# Patient Record
Sex: Male | Born: 1965 | Smoking: Never smoker
Health system: Southern US, Community
[De-identification: ages and names within clinical notes are randomized; demographics above are authoritative.]

---

## 2017-08-16 ENCOUNTER — Other Ambulatory Visit: Payer: Self-pay | Admitting: Cardiology

## 2017-08-16 DIAGNOSIS — R079 Chest pain, unspecified: Secondary | ICD-10-CM

## 2017-08-20 ENCOUNTER — Encounter (HOSPITAL_COMMUNITY)
Admission: RE | Admit: 2017-08-20 | Discharge: 2017-08-20 | Disposition: A | Discharge status: Home / Self Care | Payer: BLUE CROSS/BLUE SHIELD | Source: Ambulatory Visit | Attending: Cardiology | Admitting: Cardiology

## 2017-08-20 DIAGNOSIS — R079 Chest pain, unspecified: Secondary | ICD-10-CM

## 2017-08-20 MED ORDER — TECHNETIUM TC 99M TETROFOSMIN IV KIT
30.0000 | PACK | Freq: Once | INTRAVENOUS | Status: AC | PRN
  Administered 2017-08-20: 30 via INTRAVENOUS

## 2017-08-20 MED ORDER — TECHNETIUM TC 99M TETROFOSMIN IV KIT
10.0000 | PACK | Freq: Once | INTRAVENOUS | Status: AC | PRN
  Administered 2017-08-20: 10 via INTRAVENOUS

## 2017-08-20 MED ORDER — REGADENOSON 0.4 MG/5ML IV SOLN
INTRAVENOUS | Status: AC
  Filled 2017-08-20: qty 5

## 2017-08-20 MED ORDER — REGADENOSON 0.4 MG/5ML IV SOLN
0.4000 mg | Freq: Once | INTRAVENOUS | Status: AC
  Administered 2017-08-20: 0.4 mg via INTRAVENOUS

## 2017-08-29 ENCOUNTER — Encounter (HOSPITAL_COMMUNITY): Payer: Self-pay | Admitting: Emergency Medicine

## 2017-08-29 ENCOUNTER — Inpatient Hospital Stay (HOSPITAL_COMMUNITY)
Admission: AD | Admit: 2017-08-29 | Discharge: 2017-09-01 | DRG: 281 | Disposition: A | Discharge status: Home / Self Care | Payer: BLUE CROSS/BLUE SHIELD | Source: Other Acute Inpatient Hospital | Attending: Cardiology | Admitting: Cardiology

## 2017-08-29 ENCOUNTER — Other Ambulatory Visit: Payer: Self-pay

## 2017-08-29 DIAGNOSIS — K219 Gastro-esophageal reflux disease without esophagitis: Secondary | ICD-10-CM | POA: Diagnosis present

## 2017-08-29 DIAGNOSIS — E785 Hyperlipidemia, unspecified: Secondary | ICD-10-CM | POA: Diagnosis present

## 2017-08-29 DIAGNOSIS — N184 Chronic kidney disease, stage 4 (severe): Secondary | ICD-10-CM | POA: Diagnosis present

## 2017-08-29 DIAGNOSIS — I420 Dilated cardiomyopathy: Secondary | ICD-10-CM | POA: Diagnosis present

## 2017-08-29 DIAGNOSIS — I214 Non-ST elevation (NSTEMI) myocardial infarction: Principal | ICD-10-CM | POA: Diagnosis present

## 2017-08-29 DIAGNOSIS — E1122 Type 2 diabetes mellitus with diabetic chronic kidney disease: Secondary | ICD-10-CM | POA: Diagnosis present

## 2017-08-29 DIAGNOSIS — J189 Pneumonia, unspecified organism: Secondary | ICD-10-CM

## 2017-08-29 DIAGNOSIS — I249 Acute ischemic heart disease, unspecified: Secondary | ICD-10-CM | POA: Diagnosis present

## 2017-08-29 DIAGNOSIS — I252 Old myocardial infarction: Secondary | ICD-10-CM

## 2017-08-29 DIAGNOSIS — I25110 Atherosclerotic heart disease of native coronary artery with unstable angina pectoris: Secondary | ICD-10-CM | POA: Diagnosis present

## 2017-08-29 DIAGNOSIS — I2109 ST elevation (STEMI) myocardial infarction involving other coronary artery of anterior wall: Secondary | ICD-10-CM | POA: Diagnosis present

## 2017-08-29 DIAGNOSIS — K8020 Calculus of gallbladder without cholecystitis without obstruction: Secondary | ICD-10-CM | POA: Diagnosis present

## 2017-08-29 DIAGNOSIS — I871 Compression of vein: Secondary | ICD-10-CM

## 2017-08-29 LAB — HEPARIN LEVEL (UNFRACTIONATED)

## 2017-08-29 LAB — GLUCOSE, CAPILLARY
GLUCOSE-CAPILLARY: 124 mg/dL — AB (ref 65–99)
Glucose-Capillary: 140 mg/dL — ABNORMAL HIGH (ref 65–99)
Glucose-Capillary: 132 mg/dL — ABNORMAL HIGH (ref 65–99)

## 2017-08-29 LAB — TROPONIN I
TROPONIN I: 0.18 ng/mL — AB (ref <0.03)
TROPONIN I: 0.2 ng/mL — AB (ref <0.03)
TROPONIN I: 0.17 ng/mL — AB (ref <0.03)

## 2017-08-29 LAB — COMPREHENSIVE METABOLIC PANEL
ALBUMIN: 2.3 g/dL — AB (ref 3.5–5.0)
ALK PHOS: 223 U/L — AB (ref 38–126)
ALT: 16 U/L — ABNORMAL LOW (ref 17–63)
AST: 22 U/L (ref 15–41)
Anion gap: 8 (ref 5–15)
BILIRUBIN TOTAL: 1.1 mg/dL (ref 0.3–1.2)
BUN: 38 mg/dL — AB (ref 6–20)
CALCIUM: 9.2 mg/dL (ref 8.9–10.3)
CO2: 25 mmol/L (ref 22–32)
Chloride: 107 mmol/L (ref 101–111)
Creatinine, Ser: 2.32 mg/dL — ABNORMAL HIGH (ref 0.61–1.24)
GFR calc Af Amer: 36 mL/min — ABNORMAL LOW (ref >60)
GFR, EST NON AFRICAN AMERICAN: 31 mL/min — AB (ref >60)
GLUCOSE: 112 mg/dL — AB (ref 65–99)
Potassium: 4.3 mmol/L (ref 3.5–5.1)
Sodium: 140 mmol/L (ref 135–145)
TOTAL PROTEIN: 6 g/dL — AB (ref 6.5–8.1)

## 2017-08-29 LAB — CBC WITH DIFFERENTIAL/PLATELET
BASOS ABS: 0 10*3/uL (ref 0.0–0.1)
BASOS PCT: 1 %
EOS PCT: 1 %
Eosinophils Absolute: 0.1 10*3/uL (ref 0.0–0.7)
HEMATOCRIT: 38.8 % — AB (ref 39.0–52.0)
Hemoglobin: 13 g/dL (ref 13.0–17.0)
Lymphocytes Relative: 20 %
Lymphs Abs: 1.3 10*3/uL (ref 0.7–4.0)
MCH: 30.3 pg (ref 26.0–34.0)
MCHC: 33.5 g/dL (ref 30.0–36.0)
MCV: 90.4 fL (ref 78.0–100.0)
MONO ABS: 0.7 10*3/uL (ref 0.1–1.0)
MONOS PCT: 10 %
NEUTROS ABS: 4.6 10*3/uL (ref 1.7–7.7)
Neutrophils Relative %: 68 %
Platelets: 196 10*3/uL (ref 150–400)
RBC: 4.29 MIL/uL (ref 4.22–5.81)
RDW: 14.2 % (ref 11.5–15.5)
WBC: 6.8 10*3/uL (ref 4.0–10.5)

## 2017-08-29 LAB — HEMOGLOBIN A1C
Hgb A1c MFr Bld: 6.7 % — ABNORMAL HIGH (ref 4.8–5.6)
Mean Plasma Glucose: 145.59 mg/dL

## 2017-08-29 LAB — MRSA PCR SCREENING: MRSA BY PCR: NEGATIVE

## 2017-08-29 LAB — APTT: aPTT: 35 seconds (ref 24–36)

## 2017-08-29 LAB — MAGNESIUM: MAGNESIUM: 1.8 mg/dL (ref 1.7–2.4)

## 2017-08-29 LAB — TSH: TSH: 2.633 u[IU]/mL (ref 0.350–4.500)

## 2017-08-29 LAB — PROTIME-INR
INR: 1.18
Prothrombin Time: 15 seconds (ref 11.4–15.2)

## 2017-08-29 MED ORDER — INSULIN ASPART 100 UNIT/ML ~~LOC~~ SOLN
0.0000 [IU] | Freq: Three times a day (TID) | SUBCUTANEOUS | Status: DC
  Administered 2017-08-29 (×2): 1 [IU] via SUBCUTANEOUS
  Administered 2017-08-30: 2 [IU] via SUBCUTANEOUS
  Administered 2017-08-30 – 2017-08-31 (×2): 1 [IU] via SUBCUTANEOUS
  Administered 2017-09-01: 2 [IU] via SUBCUTANEOUS

## 2017-08-29 MED ORDER — ENSURE ENLIVE PO LIQD
237.0000 mL | Freq: Two times a day (BID) | ORAL | Status: DC
  Administered 2017-08-29 – 2017-08-30 (×2): 237 mL via ORAL

## 2017-08-29 MED ORDER — DEXTROSE 5 % IV SOLN
1.0000 g | INTRAVENOUS | Status: DC
  Administered 2017-08-30 – 2017-09-01 (×3): 1 g via INTRAVENOUS
  Filled 2017-08-29 (×3): qty 10

## 2017-08-29 MED ORDER — HEPARIN (PORCINE) IN NACL 100-0.45 UNIT/ML-% IJ SOLN: 1100.0000 [IU]/h | INTRAMUSCULAR | Status: DC

## 2017-08-29 MED ORDER — SODIUM CHLORIDE 0.9 % IV SOLN
INTRAVENOUS | Status: DC
  Administered 2017-08-29 – 2017-08-30 (×3): via INTRAVENOUS

## 2017-08-29 MED ORDER — ACETAMINOPHEN 325 MG PO TABS
650.0000 mg | ORAL_TABLET | ORAL | Status: DC | PRN
  Administered 2017-08-29: 650 mg via ORAL
  Filled 2017-08-29: qty 2

## 2017-08-29 MED ORDER — NITROGLYCERIN 0.4 MG SL SUBL: 0.4000 mg | SUBLINGUAL_TABLET | SUBLINGUAL | Status: DC | PRN

## 2017-08-29 MED ORDER — PANTOPRAZOLE SODIUM 40 MG PO TBEC
40.0000 mg | DELAYED_RELEASE_TABLET | Freq: Every day | ORAL | Status: DC
  Administered 2017-08-29 – 2017-08-31 (×3): 40 mg via ORAL
  Filled 2017-08-29 (×4): qty 1

## 2017-08-29 MED ORDER — ASPIRIN 300 MG RE SUPP: 300.0000 mg | RECTAL | Status: AC

## 2017-08-29 MED ORDER — NITROGLYCERIN IN D5W 200-5 MCG/ML-% IV SOLN: 5.0000 ug/min | INTRAVENOUS | Status: DC

## 2017-08-29 MED ORDER — METOPROLOL TARTRATE 12.5 MG HALF TABLET
12.5000 mg | ORAL_TABLET | Freq: Two times a day (BID) | ORAL | Status: DC
  Administered 2017-08-29 – 2017-09-01 (×7): 12.5 mg via ORAL
  Filled 2017-08-29 (×7): qty 1

## 2017-08-29 MED ORDER — ASPIRIN 81 MG PO CHEW
324.0000 mg | CHEWABLE_TABLET | ORAL | Status: AC
  Administered 2017-08-29: 324 mg via ORAL
  Filled 2017-08-29: qty 4

## 2017-08-29 MED ORDER — ATORVASTATIN CALCIUM 80 MG PO TABS
80.0000 mg | ORAL_TABLET | Freq: Every day | ORAL | Status: DC
  Administered 2017-08-29 – 2017-08-30 (×2): 80 mg via ORAL
  Filled 2017-08-29 (×2): qty 1

## 2017-08-29 MED ORDER — HEPARIN BOLUS VIA INFUSION
3000.0000 [IU] | Freq: Once | INTRAVENOUS | Status: AC
Start: 2017-08-29 — End: 2017-08-29
  Administered 2017-08-29: 3000 [IU] via INTRAVENOUS
  Filled 2017-08-29: qty 3000

## 2017-08-29 MED ORDER — HEPARIN BOLUS VIA INFUSION
3000.0000 [IU] | Freq: Once | INTRAVENOUS | Status: AC
  Administered 2017-08-29: 3000 [IU] via INTRAVENOUS
  Filled 2017-08-29: qty 3000

## 2017-08-29 MED ORDER — CLOPIDOGREL BISULFATE 75 MG PO TABS
75.0000 mg | ORAL_TABLET | Freq: Once | ORAL | Status: AC
  Administered 2017-08-29: 75 mg via ORAL
  Filled 2017-08-29: qty 1

## 2017-08-29 MED ORDER — ONDANSETRON HCL 4 MG/2ML IJ SOLN
4.0000 mg | Freq: Four times a day (QID) | INTRAMUSCULAR | Status: DC | PRN
  Administered 2017-08-29 – 2017-09-01 (×2): 4 mg via INTRAVENOUS
  Filled 2017-08-29 (×2): qty 2

## 2017-08-29 MED ORDER — HEPARIN (PORCINE) IN NACL 100-0.45 UNIT/ML-% IJ SOLN
1700.0000 [IU]/h | INTRAMUSCULAR | Status: DC
  Administered 2017-08-29: 1350 [IU]/h via INTRAVENOUS
  Administered 2017-08-30: 1700 [IU]/h via INTRAVENOUS
  Filled 2017-08-29 (×2): qty 250

## 2017-08-29 MED ORDER — ASPIRIN EC 81 MG PO TBEC
81.0000 mg | DELAYED_RELEASE_TABLET | Freq: Every day | ORAL | Status: DC
  Administered 2017-08-30 – 2017-09-01 (×2): 81 mg via ORAL
  Filled 2017-08-29 (×2): qty 1

## 2017-08-29 NOTE — Progress Notes (Signed)
ANTICOAGULATION CONSULT NOTE  Pharmacy Consult for heparin Indication: chest pain/ACS  No Known Allergies  Patient Measurements: Height: 5\' 5"  (165.1 cm) Weight: 152 lb 1.9 oz (69 kg) IBW/kg (Calculated) : 61.5 Heparin Dosing Weight: 69kg  Vital Signs: Temp: 98.7 F (37.1 C) (09/02 1941) Temp Source: Oral (09/02 1941) BP: 118/75 (09/02 1941) Pulse Rate: 73 (09/02 1941)  Labs:  Recent Labs  08/29/17 1002 08/29/17 1100 08/29/17 1519 08/29/17 2015  HGB 13.0  --   --   --   HCT 38.8*  --   --   --   PLT 196  --   --   --   APTT 35  --   --   --   LABPROT 15.0  --   --   --   INR 1.18  --   --   --   HEPARINUNFRC  --  <0.10*  --  <0.10*  CREATININE 2.32*  --   --   --   TROPONINI 0.18*  --  0.20*  --     Estimated Creatinine Clearance: 33.1 mL/min (A) (by C-G formula based on SCr of 2.32 mg/dL (H)).   Medical History: No past medical history on file.  Assessment: 51 year old male transferred from [facility] for ischemic evaluation after ruling in for nstemi. Patient was transferred to [facility] on IV heparin and ntg. -heparin level remains undetectable after bolus and increase   Goal of Therapy:  Heparin level 0.3-0.7 units/ml Monitor platelets by anticoagulation protocol: Yes   Plan:  -heparin 3000 unit bolus and increase to 1350 units/hr -Heparin level in 6 hours and daily wth CBC daily  Harland German, Pharm D 08/29/2017 8:50 PM

## 2017-08-29 NOTE — Progress Notes (Signed)
ANTICOAGULATION CONSULT NOTE - Initial Consult  Pharmacy Consult for heparin Indication: chest pain/ACS  No Known Allergies  Patient Measurements: Height: 5\' 5"  (165.1 cm) Weight: 152 lb 1.9 oz (69 kg) IBW/kg (Calculated) : 61.5 Heparin Dosing Weight: 69kg  Vital Signs: Temp: 98.6 F (37 C) (09/02 1149) Temp Source: Oral (09/02 1149) BP: 123/80 (09/02 1150)  Labs:  Recent Labs  08/29/17 1002 08/29/17 1100  HGB 13.0  --   HCT 38.8*  --   PLT 196  --   APTT 35  --   LABPROT 15.0  --   INR 1.18  --   HEPARINUNFRC  --  <0.10*  CREATININE 2.32*  --   TROPONINI 0.18*  --     Estimated Creatinine Clearance: 33.1 mL/min (A) (by C-G formula based on SCr of 2.32 mg/dL (H)).   Medical History: No past medical history on file.  Assessment: 51 year old male transferred from [facility] for ischemic evaluation after ruling in for nstemi. Patient was transferred to [facility] on IV heparin and ntg.  Initial heparin level on 830 units/hr was undetectable. No issues noted, will titrate rate.   Goal of Therapy:  Heparin level 0.3-0.7 units/ml Monitor platelets by anticoagulation protocol: Yes   Plan:  Give 3000 units bolus x 1 Start heparin infusion at 1100 units/hr Check anti-Xa level in 6 hours and daily while on heparin Continue to monitor H&H and platelets  Sheppard Coil PharmD., BCPS Clinical Pharmacist Pager [phone] 08/29/2017 1:14 PM

## 2017-08-29 NOTE — Progress Notes (Signed)
RN notified patients troponin elevated at 0.18, Dr. Sharyn Lull updated, received no new orders at this time.

## 2017-08-29 NOTE — H&P (Signed)
Marcus Whitaker is an 51 y.o. male.   Chief Complaint: Chest pain. 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 Patient went to [facility] because of recurrent left-sided chest pain associated with nausea took 3 sublingual nitroglycerin without relief. States Chest Pain Was Grade 4/10 and Lasted Approximately One Hour. EKG Done at [facility] Showed Anterolateral Wall MI with T-Wave Inversion in Lateral Leads and Was Noted to Have Minimally Elevated Troponin I Patient Is Transferred Here for Further Evaluation and Treatment. Patient Was Started on Heparin and IV Nitroglycerin with Relief of Chest Pain EKG from [facility] Is Not Available. Patient Was Recently Seen in My Office Vague Chest Pain off and on Subsequently Had Nuclear Stress Test Which Showed Large Fixed Inferolateral Wall Infarct with EF of 34% Noninvasive Risk Stratification High.  No past medical history on file.  No past surgical history on file.  No family history on file. Social History:  has no tobacco, alcohol, and drug history on file.  Allergies: Allergies not on file  No prescriptions prior to admission.    No results found for this or any previous visit (from the past 48 hour(s)). No results found.  Review of Systems  Constitutional: Negative for chills and fever.  Eyes: Negative for blurred vision.  Respiratory: Negative for cough and shortness of breath.   Cardiovascular: Positive for chest pain.  Gastrointestinal: Positive for nausea.  Genitourinary: Negative for dysuria.  Neurological: Negative for dizziness.    Blood pressure 130/84, temperature 98.3 F (36.8 C), temperature source  Oral. Physical Exam  Constitutional: He is oriented to person, place, and time.  HENT:  Head: Normocephalic and atraumatic.  Eyes: Pupils are equal, round, and reactive to light. Conjunctivae are normal. Left eye exhibits no discharge. No scleral icterus.  Neck: Normal range of motion. Neck supple. No JVD present. No tracheal deviation present. No thyromegaly present.  Cardiovascular: Normal rate and regular rhythm.   Murmur (2/6 systolic murmur and S4 gallop noted) heard. Respiratory: Effort normal and breath sounds normal. No respiratory distress. He has no wheezes. He has no rales.  GI: Soft. Bowel sounds are normal. He exhibits no distension. There is no tenderness.  Musculoskeletal: He exhibits no edema, tenderness or deformity.  Neurological: He is alert and oriented to person, place, and time.     Assessment/Plan Acute coronary syndrome Coronary artery disease history of anterolateral wall MI age and determined Diabetes mellitus Chronic kidney disease Hyperlipidemia GERD Plan As per orders Discussed with patient at length regarding left cardiac catheterization possible PTCA stenting its risk and benefits i.e. death MI stroke need for emergency CABG local vascular complications worsening renal function except and consents for PCI. Will schedule him for Tuesday  Rinaldo Cloud, MD 08/29/2017, 9:27 AM

## 2017-08-30 ENCOUNTER — Inpatient Hospital Stay (HOSPITAL_COMMUNITY): Payer: BLUE CROSS/BLUE SHIELD

## 2017-08-30 ENCOUNTER — Encounter (HOSPITAL_COMMUNITY): Payer: Self-pay

## 2017-08-30 LAB — CBC
HCT: 41.6 % (ref 39.0–52.0)
Hemoglobin: 14 g/dL (ref 13.0–17.0)
MCH: 30.9 pg (ref 26.0–34.0)
MCHC: 33.7 g/dL (ref 30.0–36.0)
MCV: 91.8 fL (ref 78.0–100.0)
PLATELETS: 183 10*3/uL (ref 150–400)
RBC: 4.53 MIL/uL (ref 4.22–5.81)
RDW: 14.3 % (ref 11.5–15.5)
WBC: 7.4 10*3/uL (ref 4.0–10.5)

## 2017-08-30 LAB — URINALYSIS, ROUTINE W REFLEX MICROSCOPIC
BILIRUBIN URINE: NEGATIVE
Glucose, UA: NEGATIVE mg/dL
HGB URINE DIPSTICK: NEGATIVE
Ketones, ur: NEGATIVE mg/dL
LEUKOCYTES UA: NEGATIVE
NITRITE: NEGATIVE
PH: 6 (ref 5.0–8.0)
Protein, ur: >=300 mg/dL — AB
SPECIFIC GRAVITY, URINE: 1.009 (ref 1.005–1.030)

## 2017-08-30 LAB — GLUCOSE, CAPILLARY
GLUCOSE-CAPILLARY: 91 mg/dL (ref 65–99)
GLUCOSE-CAPILLARY: 183 mg/dL — AB (ref 65–99)
Glucose-Capillary: 131 mg/dL — ABNORMAL HIGH (ref 65–99)
Glucose-Capillary: 105 mg/dL — ABNORMAL HIGH (ref 65–99)

## 2017-08-30 LAB — BASIC METABOLIC PANEL
Anion gap: 12 (ref 5–15)
BUN: 40 mg/dL — AB (ref 6–20)
CALCIUM: 8.7 mg/dL — AB (ref 8.9–10.3)
CO2: 19 mmol/L — ABNORMAL LOW (ref 22–32)
Chloride: 103 mmol/L (ref 101–111)
Creatinine, Ser: 2.28 mg/dL — ABNORMAL HIGH (ref 0.61–1.24)
GFR calc Af Amer: 37 mL/min — ABNORMAL LOW (ref >60)
GFR, EST NON AFRICAN AMERICAN: 32 mL/min — AB (ref >60)
GLUCOSE: 141 mg/dL — AB (ref 65–99)
Potassium: 4.5 mmol/L (ref 3.5–5.1)
SODIUM: 134 mmol/L — AB (ref 135–145)

## 2017-08-30 LAB — LIPID PANEL
CHOLESTEROL: 204 mg/dL — AB (ref 0–200)
HDL: 40 mg/dL — ABNORMAL LOW (ref >40)
LDL Cholesterol: 150 mg/dL — ABNORMAL HIGH (ref 0–99)
TRIGLYCERIDES: 72 mg/dL (ref <150)
Total CHOL/HDL Ratio: 5.1 RATIO
VLDL: 14 mg/dL (ref 0–40)

## 2017-08-30 LAB — PROTIME-INR
INR: 1.26
Prothrombin Time: 15.7 seconds — ABNORMAL HIGH (ref 11.4–15.2)

## 2017-08-30 LAB — HEPARIN LEVEL (UNFRACTIONATED)
HEPARIN UNFRACTIONATED: 0.12 [IU]/mL — AB (ref 0.30–0.70)
Heparin Unfractionated: 0.27 IU/mL — ABNORMAL LOW (ref 0.30–0.70)

## 2017-08-30 MED ORDER — SODIUM BICARBONATE 8.4 % IV SOLN
INTRAVENOUS | Status: DC
  Filled 2017-08-30: qty 1000

## 2017-08-30 MED ORDER — ASPIRIN 81 MG PO CHEW
81.0000 mg | CHEWABLE_TABLET | ORAL | Status: AC
  Administered 2017-08-31: 81 mg via ORAL
  Filled 2017-08-30: qty 1

## 2017-08-30 MED ORDER — SODIUM BICARBONATE BOLUS VIA INFUSION
INTRAVENOUS | Status: DC
  Filled 2017-08-30: qty 1

## 2017-08-30 MED ORDER — CLOPIDOGREL BISULFATE 300 MG PO TABS
300.0000 mg | ORAL_TABLET | Freq: Once | ORAL | Status: AC
  Administered 2017-08-31: 300 mg via ORAL
  Filled 2017-08-30: qty 1

## 2017-08-30 MED ORDER — HEPARIN BOLUS VIA INFUSION
2000.0000 [IU] | Freq: Once | INTRAVENOUS | Status: AC
  Administered 2017-08-30: 2000 [IU] via INTRAVENOUS
  Filled 2017-08-30: qty 2000

## 2017-08-30 MED ORDER — SODIUM CHLORIDE 0.9 % IV BOLUS (SEPSIS)
250.0000 mL | Freq: Once | INTRAVENOUS | Status: AC
  Administered 2017-08-30: 250 mL via INTRAVENOUS

## 2017-08-30 MED ORDER — SODIUM CHLORIDE 0.9% FLUSH
3.0000 mL | Freq: Two times a day (BID) | INTRAVENOUS | Status: DC
  Administered 2017-08-30 – 2017-08-31 (×2): 3 mL via INTRAVENOUS

## 2017-08-30 MED ORDER — ENSURE ENLIVE PO LIQD
237.0000 mL | Freq: Three times a day (TID) | ORAL | Status: DC
  Administered 2017-08-30 – 2017-09-01 (×4): 237 mL via ORAL

## 2017-08-30 MED ORDER — SODIUM CHLORIDE 0.9 % IV BOLUS (SEPSIS)
250.0000 mL | Freq: Once | INTRAVENOUS | Status: AC | PRN
  Administered 2017-08-30: 250 mL via INTRAVENOUS

## 2017-08-30 MED ORDER — SODIUM CHLORIDE 0.9 % WEIGHT BASED INFUSION
1.0000 mL/kg/h | INTRAVENOUS | Status: DC
  Administered 2017-08-31: 250 mL via INTRAVENOUS
  Administered 2017-08-31: 1 mL/kg/h via INTRAVENOUS

## 2017-08-30 MED ORDER — SODIUM CHLORIDE 0.9% FLUSH: 3.0000 mL | INTRAVENOUS | Status: DC | PRN

## 2017-08-30 MED ORDER — SODIUM CHLORIDE 0.9 % IV SOLN: 250.0000 mL | INTRAVENOUS | Status: DC | PRN

## 2017-08-30 NOTE — Progress Notes (Signed)
Patient has no appetite; RN has ordered things he suggests he might eat and family have brought in multiple different foods, but patient states he just doesn't feel like eating.  He states he has lost over 30# in the last 4-5 months and suddenly lost his desire to eat.  States he has seen his PCP and had an EGD which turned up nothing.  Dr. Sharyn Lull paged.  New order received for CT abd/pelvis w/o contrast

## 2017-08-30 NOTE — Progress Notes (Signed)
Subjective:  Spiked fever to 103 last night pancultures were obtained started on Rocephin. Denies any urinary complaints. Denies abdominal pain. Denies any cough. No further chest pain. IV nitroglycerin was DC'd last night due to hypotension.  Objective:  Vital Signs in the last 24 hours: Temp:  [98 F (36.7 C)-103.2 F (39.6 C)] 98 F (36.7 C) (09/03 0804) Pulse Rate:  [66-90] 71 (09/03 0804) Resp:  [15-37] 30 (09/03 0804) BP: (76-123)/(45-85) 104/58 (09/03 0804) SpO2:  [93 %-100 %] 100 % (09/03 0804) Weight:  [69 kg (152 lb 1.9 oz)] 69 kg (152 lb 1.9 oz) (09/02 1149)  Intake/Output from previous day: 09/02 0701 - 09/03 0700 In: 1328.1 [P.O.:260; I.V.:1018.1; IV Piggyback:50] Out: 2100 [Urine:2100] Intake/Output from this shift: Total I/O In: 540.6 [P.O.:240; I.V.:300.6] Out: -   Physical Exam: Neck: no adenopathy, no carotid bruit, no JVD and supple, symmetrical, trachea midline Lungs: clear to auscultation bilaterally Heart: regular rate and rhythm, S1, S2 normal and 2/6 systolic murmur noted Abdomen: soft, non-tender; bowel sounds normal; no masses,  no organomegaly Extremities: extremities normal, atraumatic, no cyanosis or edema  Lab Results:  Recent Labs  08/29/17 1002 08/30/17 0001  WBC 6.8 7.4  HGB 13.0 14.0  PLT 196 183    Recent Labs  08/29/17 1002 08/30/17 0001  NA 140 134*  K 4.3 4.5  CL 107 103  CO2 25 19*  GLUCOSE 112* 141*  BUN 38* 40*  CREATININE 2.32* 2.28*    Recent Labs  08/29/17 1519 08/29/17 2015  TROPONINI 0.20* 0.17*   Hepatic Function Panel  Recent Labs  08/29/17 1002  PROT 6.0*  ALBUMIN 2.3*  AST 22  ALT 16*  ALKPHOS 223*  BILITOT 1.1    Recent Labs  08/30/17 0001  CHOL 204*   No results for input(s): PROTIME in the last 72 hours.  Imaging: Imaging results have been reviewed and Dg Chest Port 1v Same Day  Result Date: 08/30/2017 CLINICAL DATA:  51 y/o  M; pneumonia. EXAM: PORTABLE CHEST 1 VIEW COMPARISON:   None. FINDINGS: Cardiomegaly. Mild basilar bronchitic changes. No focal consolidation. No pleural effusion or pneumothorax. Bones are unremarkable. IMPRESSION: Cardiomegaly. Mild basilar bronchitic changes. No focal consolidation. Electronically Signed   By: Lance  Furusawa-Stratton M.D.   On: 08/30/2017 01:38    Cardiac Studies:  Assessment/Plan:  Acute coronary syndrome Coronary artery disease history of anterolateral wall MI age undetermined Diabetes mellitus Chronic kidney disease Hyperlipidemia GERD Bronchitis Plan Discussed with patient at length again regarding left cardiac catheterization possible PTCA stenting its risk and benefits i.e. death MI stroke and for emergency CABG local vascular complications worsening renal function risk of restenosis staging the procedure in view of CKD and consents for PCI.  LOS: 1 day    Keyaan Lederman 08/30/2017, 10:39 AM    

## 2017-08-30 NOTE — Progress Notes (Signed)
ANTICOAGULATION CONSULT NOTE - Follow Up Consult  Pharmacy Consult for Heparin  Indication: chest pain/ACS  No Known Allergies  Patient Measurements: Height: 5\' 5"  (165.1 cm) Weight: 152 lb 1.9 oz (69 kg) IBW/kg (Calculated) : 61.5   Vital Signs: Temp: 98 F (36.7 C) (09/03 0804) Temp Source: Oral (09/03 0804) BP: 104/58 (09/03 0804) Pulse Rate: 71 (09/03 0804)  Labs:  Recent Labs  08/29/17 1002 08/29/17 1100 08/29/17 1519 08/29/17 2015 08/30/17 0001 08/30/17 0331  HGB 13.0  --   --   --  14.0  --   HCT 38.8*  --   --   --  41.6  --   PLT 196  --   --   --  183  --   APTT 35  --   --   --   --   --   LABPROT 15.0  --   --   --   --   --   INR 1.18  --   --   --   --   --   HEPARINUNFRC  --  <0.10*  --  <0.10*  --  0.12*  CREATININE 2.32*  --   --   --  2.28*  --   TROPONINI 0.18*  --  0.20* 0.17*  --   --     Estimated Creatinine Clearance: 33.7 mL/min (A) (by C-G formula based on SCr of 2.28 mg/dL (H)).  Assessment: Heparin for NSTEMI, heparin level sub-therapeutic this afternoon despite rate increase, no issues per RN.   Goal of Therapy:  Heparin level 0.3-0.7 units/ml Monitor platelets by anticoagulation protocol: Yes   Plan:  Increase heparin to 1700 units/hr Daily CBC/heparin level  Sheppard Coil PharmD., BCPS Clinical Pharmacist Pager [phone] 08/30/2017 2:42 PM

## 2017-08-30 NOTE — Progress Notes (Signed)
Nitro gtt discontinued because BP dropped. Dr.Harwani made of and order received to NS bolus. BP is coming up and I will continue to monitor. Patient asymptomatic.

## 2017-08-30 NOTE — Progress Notes (Signed)
Initial Nutrition Assessment  DOCUMENTATION CODES:   Non-severe (moderate) malnutrition in context of chronic illness  INTERVENTION:    Ensure Enlive po TID, each supplement provides 350 kcal and 20 grams of protein  NUTRITION DIAGNOSIS:   Malnutrition (moderate) related to chronic illness (CAD, CKD) as evidenced by mild depletion of body fat, mild depletion of muscle mass, percent weight loss.  GOAL:   Patient will meet greater than or equal to 90% of their needs  MONITOR:   PO intake, Supplement acceptance  REASON FOR ASSESSMENT:   Malnutrition Screening Tool    ASSESSMENT:   51 yo male with PMH of CAD, CKD, DM, GERD who was transferred to [facility] on 9/2 from [facility] for further evaluation of chest pain.  Patient reports 30 lb weight loss in the past 4-5 months. 16% weight loss within 6 months is significant. He c/o early satiety and just doesn't feel like eating anything. He agreed to drink Ensure Enlive supplements between meals to maximize oral intake of protein and calories. Consuming </= 25% of meals since admission. Nutrition-Focused physical exam completed. Findings are mild-mdoerate fat depletion, mild-moderate muscle depletion, and mild edema.   Diet Order:  Diet heart healthy/carb modified Room service appropriate? Yes; Fluid consistency: Thin  Skin:  Reviewed, no issues  Last BM:  9/1  Height:   Ht Readings from Last 1 Encounters:  08/29/17 5\' 5"  (1.651 m)    Weight:   Wt Readings from Last 1 Encounters:  08/29/17 152 lb 1.9 oz (69 kg)    Ideal Body Weight:  61.8 kg  BMI:  Body mass index is 25.31 kg/m.  Estimated Nutritional Needs:   Kcal:  1850-2050  Protein:  85-100 gm  Fluid:  1.8-2 L  EDUCATION NEEDS:   No education needs identified at this time  Joaquin Courts, RD, LDN, CNSC Pager [phone] After Hours Pager [phone]

## 2017-08-30 NOTE — Progress Notes (Addendum)
ANTICOAGULATION CONSULT NOTE - Follow Up Consult  Pharmacy Consult for Heparin  Indication: chest pain/ACS  No Known Allergies  Patient Measurements: Height: 5\' 5"  (165.1 cm) Weight: 152 lb 1.9 oz (69 kg) IBW/kg (Calculated) : 61.5   Vital Signs: Temp: 98.3 F (36.8 C) (09/03 0349) Temp Source: Oral (09/03 0349) BP: 86/57 (09/03 0349) Pulse Rate: 72 (09/03 0349)  Labs:  Recent Labs  08/29/17 1002 08/29/17 1100 08/29/17 1519 08/29/17 2015 08/30/17 0001 08/30/17 0331  HGB 13.0  --   --   --  14.0  --   HCT 38.8*  --   --   --  41.6  --   PLT 196  --   --   --  183  --   APTT 35  --   --   --   --   --   LABPROT 15.0  --   --   --   --   --   INR 1.18  --   --   --   --   --   HEPARINUNFRC  --  <0.10*  --  <0.10*  --  0.12*  CREATININE 2.32*  --   --   --  2.28*  --   TROPONINI 0.18*  --  0.20* 0.17*  --   --     Estimated Creatinine Clearance: 33.7 mL/min (A) (by C-G formula based on SCr of 2.28 mg/dL (H)).  Assessment: Heparin for NSTEMI, heparin level sub-therapeutic despite rate increase, no issues per RN.   Goal of Therapy:  Heparin level 0.3-0.7 units/ml Monitor platelets by anticoagulation protocol: Yes   Plan:  Heparin 2000 units [employer] heparin drip to 1550 units/hr 1300 HL Daily CBC/HL Monitor for bleeding   Abran Duke 08/30/2017,4:51 AM

## 2017-08-31 ENCOUNTER — Encounter (HOSPITAL_COMMUNITY): Payer: Self-pay

## 2017-08-31 ENCOUNTER — Inpatient Hospital Stay (HOSPITAL_COMMUNITY): Payer: BLUE CROSS/BLUE SHIELD

## 2017-08-31 ENCOUNTER — Encounter (HOSPITAL_COMMUNITY)
Admission: AD | Disposition: A | Discharge status: Home / Self Care | Payer: Self-pay | Source: Other Acute Inpatient Hospital | Attending: Cardiology

## 2017-08-31 HISTORY — PX: LEFT HEART CATH AND CORONARY ANGIOGRAPHY: CATH118249

## 2017-08-31 LAB — BASIC METABOLIC PANEL
Anion gap: 8 (ref 5–15)
BUN: 41 mg/dL — AB (ref 6–20)
CHLORIDE: 105 mmol/L (ref 101–111)
CO2: 22 mmol/L (ref 22–32)
Calcium: 8.1 mg/dL — ABNORMAL LOW (ref 8.9–10.3)
Creatinine, Ser: 2.33 mg/dL — ABNORMAL HIGH (ref 0.61–1.24)
GFR calc Af Amer: 36 mL/min — ABNORMAL LOW (ref >60)
GFR calc non Af Amer: 31 mL/min — ABNORMAL LOW (ref >60)
GLUCOSE: 102 mg/dL — AB (ref 65–99)
POTASSIUM: 4.4 mmol/L (ref 3.5–5.1)
SODIUM: 135 mmol/L (ref 135–145)

## 2017-08-31 LAB — ECHOCARDIOGRAM COMPLETE
HEIGHTINCHES: 65 in
Weight: 2426.82 oz

## 2017-08-31 LAB — CBC
HCT: 36.8 % — ABNORMAL LOW (ref 39.0–52.0)
HEMOGLOBIN: 12.3 g/dL — AB (ref 13.0–17.0)
MCH: 30.4 pg (ref 26.0–34.0)
MCHC: 33.4 g/dL (ref 30.0–36.0)
MCV: 91.1 fL (ref 78.0–100.0)
Platelets: 201 10*3/uL (ref 150–400)
RBC: 4.04 MIL/uL — AB (ref 4.22–5.81)
RDW: 14.3 % (ref 11.5–15.5)
WBC: 7.6 10*3/uL (ref 4.0–10.5)

## 2017-08-31 LAB — GLUCOSE, CAPILLARY
Glucose-Capillary: 104 mg/dL — ABNORMAL HIGH (ref 65–99)
Glucose-Capillary: 110 mg/dL — ABNORMAL HIGH (ref 65–99)
Glucose-Capillary: 143 mg/dL — ABNORMAL HIGH (ref 65–99)
Glucose-Capillary: 129 mg/dL — ABNORMAL HIGH (ref 65–99)
Glucose-Capillary: 134 mg/dL — ABNORMAL HIGH (ref 65–99)

## 2017-08-31 LAB — HEPARIN LEVEL (UNFRACTIONATED): Heparin Unfractionated: 0.35 IU/mL (ref 0.30–0.70)

## 2017-08-31 LAB — URINE CULTURE: Culture: NO GROWTH

## 2017-08-31 LAB — HIV ANTIBODY (ROUTINE TESTING W REFLEX): HIV SCREEN 4TH GENERATION: NONREACTIVE

## 2017-08-31 LAB — TROPONIN I: Troponin I: 0.13 ng/mL (ref <0.03)

## 2017-08-31 SURGERY — LEFT HEART CATH AND CORONARY ANGIOGRAPHY
Anesthesia: LOCAL

## 2017-08-31 MED ORDER — HEPARIN (PORCINE) IN NACL 2-0.9 UNIT/ML-% IJ SOLN
INTRAMUSCULAR | Status: AC | PRN
  Administered 2017-08-31: 1000 mL

## 2017-08-31 MED ORDER — IOPAMIDOL (ISOVUE-370) INJECTION 76%
INTRAVENOUS | Status: AC
  Filled 2017-08-31: qty 100

## 2017-08-31 MED ORDER — SODIUM CHLORIDE 0.9% FLUSH
3.0000 mL | Freq: Two times a day (BID) | INTRAVENOUS | Status: DC
  Administered 2017-08-31 – 2017-09-01 (×3): 3 mL via INTRAVENOUS

## 2017-08-31 MED ORDER — LIDOCAINE HCL (PF) 1 % IJ SOLN
INTRAMUSCULAR | Status: AC
  Filled 2017-08-31: qty 30

## 2017-08-31 MED ORDER — MIDAZOLAM HCL 2 MG/2ML IJ SOLN
INTRAMUSCULAR | Status: AC
  Filled 2017-08-31: qty 2

## 2017-08-31 MED ORDER — MIDAZOLAM HCL 2 MG/2ML IJ SOLN
INTRAMUSCULAR | Status: DC | PRN
  Administered 2017-08-31: 1 mg via INTRAVENOUS

## 2017-08-31 MED ORDER — ATORVASTATIN CALCIUM 40 MG PO TABS
40.0000 mg | ORAL_TABLET | Freq: Every day | ORAL | Status: DC
  Administered 2017-08-31: 40 mg via ORAL
  Filled 2017-08-31: qty 1

## 2017-08-31 MED ORDER — LIDOCAINE HCL (PF) 1 % IJ SOLN
INTRAMUSCULAR | Status: DC | PRN
  Administered 2017-08-31: 15 mL via SUBCUTANEOUS

## 2017-08-31 MED ORDER — SODIUM CHLORIDE 0.9 % IV SOLN: 250.0000 mL | INTRAVENOUS | Status: DC | PRN

## 2017-08-31 MED ORDER — FENTANYL CITRATE (PF) 100 MCG/2ML IJ SOLN
INTRAMUSCULAR | Status: AC
  Filled 2017-08-31: qty 2

## 2017-08-31 MED ORDER — SODIUM CHLORIDE 0.9% FLUSH: 3.0000 mL | INTRAVENOUS | Status: DC | PRN

## 2017-08-31 MED ORDER — IOPAMIDOL (ISOVUE-370) INJECTION 76%
INTRAVENOUS | Status: DC | PRN
  Administered 2017-08-31: 20 mL via INTRA_ARTERIAL

## 2017-08-31 MED ORDER — FENTANYL CITRATE (PF) 100 MCG/2ML IJ SOLN
INTRAMUSCULAR | Status: DC | PRN
  Administered 2017-08-31: 25 ug via INTRAVENOUS

## 2017-08-31 MED ORDER — SODIUM CHLORIDE 0.9 % IV SOLN: INTRAVENOUS | Status: AC

## 2017-08-31 SURGICAL SUPPLY — 7 items
CATH IMPULSE 5F ANG/FL3.5 (CATHETERS) ×2 IMPLANT
KIT HEART LEFT (KITS) ×2 IMPLANT
PACK CARDIAC CATHETERIZATION (CUSTOM PROCEDURE TRAY) ×2 IMPLANT
SHEATH PINNACLE 5F 10CM (SHEATH) ×2 IMPLANT
SYR MEDRAD MARK V 150ML (SYRINGE) IMPLANT
TRANSDUCER W/STOPCOCK (MISCELLANEOUS) ×2 IMPLANT
WIRE EMERALD 3MM-J .035X150CM (WIRE) ×2 IMPLANT

## 2017-08-31 NOTE — Progress Notes (Signed)
Spit some bright red blood , md made aware, with order to stop heparin gtt -done. Mouth wash done.

## 2017-08-31 NOTE — Progress Notes (Signed)
Transported to the cath lab by bed. 

## 2017-08-31 NOTE — Interval H&P Note (Signed)
History and Physical Interval Note:  08/31/2017 10:39 AM  Marcus Whitaker  has presented today for surgery, with the diagnosis of unstable angina  The various methods of treatment have been discussed with the patient and family. After consideration of risks, benefits and other options for treatment, the patient has consented to  Procedure(s): LEFT HEART CATH AND CORONARY ANGIOGRAPHY (N/A) as a surgical intervention .  The patient's history has been reviewed, patient examined, no change in status, stable for surgery.  I have reviewed the patient's chart and labs.  Questions were answered to the patient's satisfaction.     Rinaldo Cloud

## 2017-08-31 NOTE — Progress Notes (Signed)
Back from the cath lab by bed awake and alert. Right groin dressing dry and intact. Bed rest emphasized, to avoid bending right knee.

## 2017-08-31 NOTE — Progress Notes (Signed)
Received a call from  cardiac cath. To start sodium bicarb- bolus given.then followed by 70 ml/hr.

## 2017-08-31 NOTE — H&P (View-Only) (Signed)
Subjective:  Spiked fever to 103 last night pancultures were obtained started on Rocephin. Denies any urinary complaints. Denies abdominal pain. Denies any cough. No further chest pain. IV nitroglycerin was DC'd last night due to hypotension.  Objective:  Vital Signs in the last 24 hours: Temp:  [98 F (36.7 C)-103.2 F (39.6 C)] 98 F (36.7 C) (09/03 0804) Pulse Rate:  [66-90] 71 (09/03 0804) Resp:  [15-37] 30 (09/03 0804) BP: (76-123)/(45-85) 104/58 (09/03 0804) SpO2:  [93 %-100 %] 100 % (09/03 0804) Weight:  [69 kg (152 lb 1.9 oz)] 69 kg (152 lb 1.9 oz) (09/02 1149)  Intake/Output from previous day: 09/02 0701 - 09/03 0700 In: 1328.1 [P.O.:260; I.V.:1018.1; IV Piggyback:50] Out: 2100 [Urine:2100] Intake/Output from this shift: Total I/O In: 540.6 [P.O.:240; I.V.:300.6] Out: -   Physical Exam: Neck: no adenopathy, no carotid bruit, no JVD and supple, symmetrical, trachea midline Lungs: clear to auscultation bilaterally Heart: regular rate and rhythm, S1, S2 normal and 2/6 systolic murmur noted Abdomen: soft, non-tender; bowel sounds normal; no masses,  no organomegaly Extremities: extremities normal, atraumatic, no cyanosis or edema  Lab Results:  Recent Labs  08/29/17 1002 08/30/17 0001  WBC 6.8 7.4  HGB 13.0 14.0  PLT 196 183    Recent Labs  08/29/17 1002 08/30/17 0001  NA 140 134*  K 4.3 4.5  CL 107 103  CO2 25 19*  GLUCOSE 112* 141*  BUN 38* 40*  CREATININE 2.32* 2.28*    Recent Labs  08/29/17 1519 08/29/17 2015  TROPONINI 0.20* 0.17*   Hepatic Function Panel  Recent Labs  08/29/17 1002  PROT 6.0*  ALBUMIN 2.3*  AST 22  ALT 16*  ALKPHOS 223*  BILITOT 1.1    Recent Labs  08/30/17 0001  CHOL 204*   No results for input(s): PROTIME in the last 72 hours.  Imaging: Imaging results have been reviewed and Dg Chest Port 1v Same Day  Result Date: 08/30/2017 CLINICAL DATA:  51 y/o  M; pneumonia. EXAM: PORTABLE CHEST 1 VIEW COMPARISON:   None. FINDINGS: Cardiomegaly. Mild basilar bronchitic changes. No focal consolidation. No pleural effusion or pneumothorax. Bones are unremarkable. IMPRESSION: Cardiomegaly. Mild basilar bronchitic changes. No focal consolidation. Electronically Signed   By: Mitzi Hansen M.D.   On: 08/30/2017 01:38    Cardiac Studies:  Assessment/Plan:  Acute coronary syndrome Coronary artery disease history of anterolateral wall MI 51 Diabetes mellitus Chronic kidney disease Hyperlipidemia GERD Bronchitis Plan Discussed with patient at length again regarding left cardiac catheterization possible PTCA stenting its risk and benefits i.e. death MI stroke and for emergency CABG local vascular complications worsening renal function risk of restenosis staging the procedure in view of CKD and consents for PCI.  LOS: 1 day    Rinaldo Cloud 08/30/2017, 10:39 AM

## 2017-08-31 NOTE — Interval H&P Note (Signed)
Cath Lab Visit (complete for each Cath Lab visit)  Clinical Evaluation Leading to the Procedure:   ACS: Yes.    Non-ACS:    Anginal Classification: CCS III  Anti-ischemic medical therapy: Minimal Therapy (1 class of medications)  Non-Invasive Test Results: High-risk stress test findings: cardiac mortality >3%/year  Prior CABG: No previous CABG      History and Physical Interval Note:  08/31/2017 10:40 AM  Marcus Whitaker  has presented today for surgery, with the diagnosis of unstable angina  The various methods of treatment have been discussed with the patient and family. After consideration of risks, benefits and other options for treatment, the patient has consented to  Procedure(s): LEFT HEART CATH AND CORONARY ANGIOGRAPHY (N/A) as a surgical intervention .  The patient's history has been reviewed, patient examined, no change in status, stable for surgery.  I have reviewed the patient's chart and labs.  Questions were answered to the patient's satisfaction.     Rinaldo Cloud

## 2017-08-31 NOTE — Progress Notes (Signed)
Site area: right groin fa sheath pulled and pressure held by [employer] Prior to Removal:  Level 0 Pressure Applied For: 20 minutes Manual:   yes Patient Status During Pull:  stable Post Pull Site:  Level 0 Post Pull Instructions Given:  yes Post Pull Pulses Present: palpable Dressing Applied:  Gauze and tegaderm Bedrest begins @ 1205 Comments:

## 2017-08-31 NOTE — Progress Notes (Signed)
  Echocardiogram 2D Echocardiogram has been performed.  [name] 08/31/2017, 10:33 AM

## 2017-09-01 ENCOUNTER — Encounter (HOSPITAL_COMMUNITY): Payer: Self-pay | Admitting: *Deleted

## 2017-09-01 LAB — GLUCOSE, CAPILLARY
GLUCOSE-CAPILLARY: 112 mg/dL — AB (ref 65–99)
Glucose-Capillary: 166 mg/dL — ABNORMAL HIGH (ref 65–99)

## 2017-09-01 LAB — BASIC METABOLIC PANEL
Anion gap: 9 (ref 5–15)
BUN: 38 mg/dL — AB (ref 6–20)
CALCIUM: 8.5 mg/dL — AB (ref 8.9–10.3)
CO2: 24 mmol/L (ref 22–32)
Chloride: 105 mmol/L (ref 101–111)
Creatinine, Ser: 2.5 mg/dL — ABNORMAL HIGH (ref 0.61–1.24)
GFR calc Af Amer: 33 mL/min — ABNORMAL LOW (ref >60)
GFR, EST NON AFRICAN AMERICAN: 28 mL/min — AB (ref >60)
GLUCOSE: 150 mg/dL — AB (ref 65–99)
Potassium: 3.9 mmol/L (ref 3.5–5.1)
Sodium: 138 mmol/L (ref 135–145)

## 2017-09-01 LAB — CBC
HEMATOCRIT: 35.7 % — AB (ref 39.0–52.0)
Hemoglobin: 11.9 g/dL — ABNORMAL LOW (ref 13.0–17.0)
MCH: 30.4 pg (ref 26.0–34.0)
MCHC: 33.3 g/dL (ref 30.0–36.0)
MCV: 91.1 fL (ref 78.0–100.0)
PLATELETS: 196 10*3/uL (ref 150–400)
RBC: 3.92 MIL/uL — ABNORMAL LOW (ref 4.22–5.81)
RDW: 14.6 % (ref 11.5–15.5)
WBC: 6.8 10*3/uL (ref 4.0–10.5)

## 2017-09-01 MED ORDER — ATORVASTATIN CALCIUM 40 MG PO TABS: 40.0000 mg | ORAL_TABLET | Freq: Every day | ORAL | 3 refills | Status: AC

## 2017-09-01 MED ORDER — GLIMEPIRIDE 1 MG PO TABS: 1.0000 mg | ORAL_TABLET | ORAL | 11 refills | Status: AC

## 2017-09-01 MED ORDER — LEVOFLOXACIN 500 MG PO TABS: 500.0000 mg | ORAL_TABLET | ORAL | 0 refills | Status: AC

## 2017-09-01 NOTE — Discharge Instructions (Signed)
Coronary Angiogram °A coronary angiogram is an X-ray procedure that is used to examine the arteries in the heart. In this procedure, a dye (contrast dye) is injected through a long, thin tube (catheter). The catheter is inserted through the groin, wrist, or arm. The dye is injected into each artery, then X-rays are taken to show if there is a blockage in the arteries of the heart. This procedure can also show if you have valve disease or a disease of the aorta, and it can be used to check the overall function of your heart muscle. You may have a coronary angiogram if: °· You are having chest pain, or other symptoms of angina, and you are at risk for heart disease. °· You have an abnormal electrocardiogram (ECG) or stress test. °· You have chest pain and heart failure. °· You are having irregular heart rhythms. °· You and your health care provider determine that the benefits of the test information outweigh the risks of the procedure. ° °Let your health care provider know about: °· Any allergies you have, including allergies to contrast dye. °· All medicines you are taking, including vitamins, herbs, eye drops, creams, and over-the-counter medicines. °· Any problems you or family members have had with anesthetic medicines. °· Any blood disorders you have. °· Any surgeries you have had. °· History of kidney problems or kidney failure. °· Any medical conditions you have. °· Whether you are pregnant or may be pregnant. °What are the risks? °Generally, this is a safe procedure. However, problems may occur, including: °· Infection. °· Allergic reaction to medicines or dyes that are used. °· Bleeding from the access site or other locations. °· Kidney injury, especially in people with impaired kidney function. °· Stroke (rare). °· Heart attack (rare). °· Damage to other structures or organs. ° °What happens before the procedure? °Staying hydrated °Follow instructions from your health care provider about hydration, which may  include: °· Up to 2 hours before the procedure - you may continue to drink clear liquids, such as water, clear fruit juice, black coffee, and plain tea. ° °Eating and drinking restrictions °Follow instructions from your health care provider about eating and drinking, which may include: °· 8 hours before the procedure - stop eating heavy meals or foods such as meat, fried foods, or fatty foods. °· 6 hours before the procedure - stop eating light meals or foods, such as toast or cereal. °· 2 hours before the procedure - stop drinking clear liquids. ° °General instructions °· Ask your health care provider about: °? Changing or stopping your regular medicines. This is especially important if you are taking diabetes medicines or blood thinners. °? Taking medicines such as ibuprofen. These medicines can thin your blood. Do not take these medicines before your procedure if your health care provider instructs you not to, though aspirin may be recommended prior to coronary angiograms. °· Plan to have someone take you home from the hospital or clinic. °· You may need to have blood tests or X-rays done. °What happens during the procedure? °· An IV tube will be inserted into one of your veins. °· You will be given one or more of the following: °? A medicine to help you relax (sedative). °? A medicine to numb the area where the catheter will be inserted into an artery (local anesthetic). °· To reduce your risk of infection: °? Your health care team will wash or sanitize their hands. °? Your skin will be washed with soap. °?   Hair may be removed from the area where the catheter will be inserted. °· You will be connected to a continuous ECG monitor. °· The catheter will be inserted into an artery. The location may be in your groin, in your wrist, or in the fold of your arm (near your elbow). °· A type of X-ray (fluoroscopy) will be used to help guide the catheter to the opening of the blood vessel that is being examined. °· A dye  will be injected into the catheter, and X-rays will be taken. The dye will help to show where any narrowing or blockages are located in the heart arteries. °· Tell your health care provider if you have any chest pain or trouble breathing during the procedure. °· If blockages are found, your health care provider may perform another procedure, such as inserting a coronary stent. °The procedure may vary among health care providers and hospitals. °What happens after the procedure? °· After the procedure, you will need to keep the area still for a few hours, or for as long as told by your health care provider. If the procedure is done through the groin, you will be instructed to not bend and not cross your legs. °· The insertion site will be checked frequently. °· The pulse in your foot or wrist will be checked frequently. °· You may have additional blood tests, X-rays, and a test that records the electrical activity of your heart (ECG). °· Do not drive for 24 hours if you were given a sedative. °Summary °· A coronary angiogram is an X-ray procedure that is used to look into the arteries in the heart. °· During the procedure, a dye (contrast dye) is injected through a long, thin tube (catheter). The catheter is inserted through the groin, wrist, or arm. °· Tell your health care provider about any allergies you have, including allergies to contrast dye. °· After the procedure, you will need to keep the area still for a few hours, or for as long as told by your health care provider. °This information is not intended to replace advice given to you by your health care provider. Make sure you discuss any questions you have with your health care provider. °Document Released: 06/20/2003 Document Revised: 09/25/2016 Document Reviewed: 09/25/2016 °Elsevier Interactive Patient Education © 2018 [employer] °Acute Coronary Syndrome °Acute coronary syndrome (ACS) is a serious problem in which there is suddenly not enough blood and  oxygen supplied to the heart. ACS may mean that one or more of the blood vessels in your heart (coronary arteries) may be blocked. ACS can result in chest pain or a heart attack (myocardial infarction or MI). °What are the causes? °This condition is caused by atherosclerosis, which is the buildup of fat and cholesterol (plaque) on the inside of the arteries. Over time, the plaque may narrow or block the artery, and this will lessen blood flow to the heart. Plaque can also become weak and break off within a coronary artery to form a clot and cause a sudden blockage. °What increases the risk? °The risk factors of this condition include: °· High cholesterol levels. °· High blood pressure (hypertension). °· Smoking. °· Diabetes. °· Age. °· Family history of chest pain, heart disease, or stroke. °· Lack of exercise. ° °What are the signs or symptoms? °The most common signs of this condition include: °· Chest pain, which can be: °? A crushing or squeezing in the chest. °? A tightness, pressure, fullness, or heaviness in the chest. °?   Present for more than a few minutes, or it can stop and recur. °· Pain in the arms, neck, jaw, or back. °· Unexplained heartburn or indigestion. °· Shortness of breath. °· Nausea. °· Sudden cold sweats. °· Feeling light-headed or dizzy. ° °Sometimes, this condition has no symptoms. °How is this diagnosed? °ACS may be diagnosed through the following tests: °· Electrocardiogram (ECG). °· Blood tests. °· Coronary angiogram. This is a procedure to look at the coronary arteries to see if there is any blockage. ° °How is this treated? °Treatment for ACS may include: °· Healthy behavioral changes to reduce or control risk factors. °· Medicine. °· Coronary stenting. A stent helps to keep an artery open. °· Coronary angioplasty. This procedure widens a narrowed or blocked artery. °· Coronary artery bypass surgery. This will allow your blood to pass the blockage (bypass) to reach your heart. ° °Follow  these instructions at home: °Eating and drinking °· Follow a heart-healthy diet. A dietitian can you help to educate you about healthy food options and changes. °· Use healthy cooking methods such as roasting, grilling, broiling, baking, poaching, steaming, or stir-frying. Talk to a dietitian to learn more about healthy cooking methods. °Medicines °· Take medicines only as directed by your health care provider. °· Do not take the following medicines unless your health care provider approves: °? Nonsteroidal anti-inflammatory drugs (NSAIDs), such as ibuprofen, naproxen, or celecoxib. °? Vitamin supplements that contain vitamin A, vitamin E, or both. °? Hormone replacement therapy that contains estrogen with or without progestin. °· Stop illegal drug use. °Activity °· Follow an exercise program that is approved by your health care provider. °· Plan rest periods when you are fatigued. °Lifestyle °· Do not use any tobacco products, including cigarettes, chewing tobacco, or electronic cigarettes. If you need help quitting, ask your health care provider. °· If you drink alcohol, and your health care provider approves, limit your alcohol intake to no more than 1 drink per day. One drink equals 12 ounces of beer, 5 ounces of wine, or 1½ ounces of hard liquor. °· Learn to manage stress. °· Maintain a healthy weight. Lose weight as approved by your health care provider. °General instructions °· Manage other health conditions, such as hypertension and diabetes, as directed by your health care provider. °· Keep all follow-up visits as directed by your health care provider. This is important. °· Your health care provider may ask you to monitor your blood pressure. A blood pressure reading consists of a higher number over a lower number, such as 110 over 72, written as 110/72. Ideally, your blood pressure should be: °? Below 140/90 if you have no other medical conditions. °? Below 130/80 if you have diabetes or kidney  disease. °Get help right away if: °· You have pain in your chest, neck, arm, jaw, stomach, or back that lasts more than a few minutes, is recurring, or is not relieved by taking medicine under your tongue (sublingual nitroglycerin). °· You have profuse sweating without cause. °· You have unexplained: °? Heartburn or indigestion. °? Shortness of breath or difficulty breathing. °? Nausea or vomiting. °? Fatigue. °? Feelings of nervousness or anxiety. °? Weakness. °? Diarrhea. °· You have sudden light-headedness or dizziness. °· You faint. °These symptoms may represent a serious problem that is an emergency. Do not wait to see if the symptoms will go away. Get medical help right away. Call your local emergency services (911 in the [place] Do not drive yourself to the clinic or hospital. °  This information is not intended to replace advice given to you by your health care provider. Make sure you discuss any questions you have with your health care provider. °Document Released: 12/14/2005 Document Revised: 05/27/2016 Document Reviewed: 04/17/2014 °Elsevier Interactive Patient Education © 2017 [employer] ° °

## 2017-09-01 NOTE — Discharge Summary (Signed)
Marcus Whitaker, Marcus Whitaker                ACCOUNT NO.:  [id number]  MEDICAL RECORD NO.:  [id number]  LOCATION:  2C17C                        FACILITY:  [facility]  PHYSICIAN:  Eduardo Osier. Sharyn Lull, M.D. DATE OF BIRTH:  1966/05/25  DATE OF ADMISSION:  08/29/2017 DATE OF DISCHARGE:  09/01/2017                              DISCHARGE SUMMARY   ADMITTING DIAGNOSES: 1. Acute coronary syndrome. 2. Coronary artery disease.  History of anterolateral wall myocardial     infarction, age undetermined. 3. Diabetes mellitus. 4. Chronic kidney disease stage 4. 5. Hyperlipidemia. 6. Gastroesophageal reflux disease.  DISCHARGE DIAGNOSES: 1. Probable very small non-Q-wave myocardial infarction, status post     left cardiac catheterization. 2. Nonobstructive coronary artery disease. 3. Nonischemic dilated cardiomyopathy. 4. Diabetes mellitus. 5. Chronic kidney disease stage 4. 6. Hyperlipidemia. 7. Gastroesophageal reflux disease. 8. Cholelithiasis/status post cholecystitis.  DISCHARGE HOME MEDICATIONS: 1. Levaquin 500 mg 1 tablet every 48 hours for 8 days. 2. Aspirin 81 mg 1 tablet daily. 3. Metoprolol succinate 25 mg half tablet daily. 4. Nitrostat sublingual p.r.n. 5. Pantoprazole 40 mg daily. 6. Atorvastatin 40 mg daily. 7. Amaryl 1 mg daily. The patient has been advised to stop clopidogrel, losartan, and metformin for now.  DIET:  Low salt, low cholesterol, 1800 calories ADA diet.  DISCHARGE INSTRUCTIONS:  Monitor blood pressure and blood sugar daily and chart.  The patient has been advised to refrain from NSAIDs.  CONDITION AT DISCHARGE:  Stable.  Post cardiac cath instructions have been given.  FOLLOWUP:  Follow up with me in 1 week.  BRIEF HISTORY AND HOSPITAL COURSE:  Marcus Whitaker is a 51 year old male with past medical history significant for coronary artery disease, history of possible recent anterolateral wall myocardial infarction, noninsulin-dependent diabetes mellitus, chronic kidney  disease stage 4, GERD.  He was transferred from [facility] for further evaluation and treatment.  The patient went to the [facility] because of recurrent left-sided chest pain associated with nausea.  Took 3 sublingual nitro without relief.  States chest pain was grade 4/10 and lasted approximately 1 hour.  EKG done at [facility] showed anterolateral wall MI with T-wave inversion in lateral leads and was noted to have minimally elevated troponin.  The patient was transferred here for further evaluation and management.  The patient was started on IV heparin and nitro with relief of chest pain. The patient was recently seen in my office because of vague chest pain off and on.  Subsequently, had nuclear stress test, which showed large fixed inferolateral wall infarct with EF of 34%, noninvasive, risk stratification was high.  PHYSICAL EXAMINATION:  GENERAL:  On examination, he was alert, awake, oriented x3. VITAL SIGNS:  Blood pressure was 130/84, he was afebrile. HEENT:  Conjunctivae were pink. NECK:  Supple.  No JVD.  No bruit. LUNGS:  Clear to auscultation.  Without rhonchi or rales. CARDIOVASCULAR:  S1 and S2 were normal.  There was 2/6 systolic murmur and S4 gallop. ABDOMEN:  Soft.  Nontender. EXTREMITIES:  There was no clubbing, cyanosis, or edema.  LABORATORY DATA:  Sodium was 135, potassium 4.4, BUN was 41, creatinine 2.33.  Hemoglobin was 12.3, hematocrit 36.8.  His  troponin I 0.18, 0.20, 0.17, 0.13, which is trending down.  Repeat electrolytes today sodium 135, potassium 4.4, BUN 41, creatinine 2.33, which is stable. Hemoglobin is 11.9, hematocrit 35.7, white count of 6.8.  BRIEF HOSPITAL COURSE:  The patient was admitted to step-down unit.  The patient ruled in for probable small non-Q-wave myocardial infarction due to elevated troponin I and chest pain.  The patient also spiked to 103. Pancultures were obtained, which have  been negative.  CT of the abdomen was also obtained, which showed cholelithiasis, nonspecific borderline mild diffuse gallbladder wall thickening.  There was clinical concern for acute cholecystitis.  There was no evidence of small-bowel obstruction.  There were cardiomegaly and small pericardial effusion with mild pulmonary edema.  The patient was started on Rocephin with resolution of his fever and his white count remained stable.  Abdominal exam was benign.  Rocephin IV has been switched to p.o. Levaquin.  The patient has remained afebrile during the hospital stay.  The patient subsequently underwent left cardiac catheterization with selective left and right coronary angiography as per procedure report, the patient tolerated procedure well.  Post procedure, the patient did not have any episodes of chest pain.  During the hospital stay, his groin was stable with no evidence of hematoma or bruit.  The patient is ambulating in room without any problems.  The patient had significant weight loss, approximately 30 pounds in the last few months.  Workup so far is negative.  The patient will be followed up for evaluation of weight loss as outpatient by his PMD.  The patient also will be referred to Renal service as per his PMD.  We will hold off his ACE inhibitors.  We will hold of his angiotensin receptor blocker for now in view of renal insufficiency and also switch his metformin to Amaryl as above due to renal insufficiency.  We will monitor his renal function closely next week.    Eduardo Osier. Sharyn Lull, M.D.    [facility]  D:  09/01/2017  T:  09/01/2017  Job:  [id number]

## 2017-09-01 NOTE — Discharge Summary (Signed)
Discharge summary dictated on 09/01/2017.  Dictation number is [id number]

## 2017-09-01 NOTE — Progress Notes (Signed)
Reviewed discharge instructions with pt including new medications , s/s, and when to take. Reviewed right groin cath site and how to care for it. Pt verbalizes understanding. Pt taken out in wheelchair and niece took him home.

## 2017-09-02 ENCOUNTER — Ambulatory Visit (HOSPITAL_COMMUNITY): Admission: RE | Admit: 2017-09-02 | Payer: BLUE CROSS/BLUE SHIELD | Source: Ambulatory Visit | Admitting: Cardiology

## 2017-09-02 SURGERY — LEFT HEART CATH AND CORONARY ANGIOGRAPHY
Anesthesia: LOCAL

## 2017-09-04 LAB — CULTURE, BLOOD (ROUTINE X 2)
CULTURE: NO GROWTH
CULTURE: NO GROWTH
Special Requests: ADEQUATE
Special Requests: ADEQUATE

## 2017-09-06 ENCOUNTER — Other Ambulatory Visit: Payer: Self-pay

## 2017-09-06 ENCOUNTER — Other Ambulatory Visit (HOSPITAL_COMMUNITY): Payer: Self-pay

## 2017-09-06 ENCOUNTER — Emergency Department (HOSPITAL_COMMUNITY): Payer: BLUE CROSS/BLUE SHIELD

## 2017-09-06 ENCOUNTER — Inpatient Hospital Stay (HOSPITAL_COMMUNITY): Payer: BLUE CROSS/BLUE SHIELD

## 2017-09-06 ENCOUNTER — Inpatient Hospital Stay (HOSPITAL_COMMUNITY)
Admission: EM | Admit: 2017-09-06 | Discharge: 2017-09-27 | DRG: 308 | Disposition: E | Payer: BLUE CROSS/BLUE SHIELD | Attending: Pulmonary Disease | Admitting: Pulmonary Disease

## 2017-09-06 DIAGNOSIS — I252 Old myocardial infarction: Secondary | ICD-10-CM

## 2017-09-06 DIAGNOSIS — I472 Ventricular tachycardia: Principal | ICD-10-CM | POA: Diagnosis present

## 2017-09-06 DIAGNOSIS — E872 Acidosis: Secondary | ICD-10-CM | POA: Diagnosis present

## 2017-09-06 DIAGNOSIS — K219 Gastro-esophageal reflux disease without esophagitis: Secondary | ICD-10-CM | POA: Diagnosis present

## 2017-09-06 DIAGNOSIS — R9401 Abnormal electroencephalogram [EEG]: Secondary | ICD-10-CM | POA: Diagnosis present

## 2017-09-06 DIAGNOSIS — E1165 Type 2 diabetes mellitus with hyperglycemia: Secondary | ICD-10-CM | POA: Diagnosis present

## 2017-09-06 DIAGNOSIS — Y95 Nosocomial condition: Secondary | ICD-10-CM | POA: Diagnosis present

## 2017-09-06 DIAGNOSIS — Z7982 Long term (current) use of aspirin: Secondary | ICD-10-CM

## 2017-09-06 DIAGNOSIS — I129 Hypertensive chronic kidney disease with stage 1 through stage 4 chronic kidney disease, or unspecified chronic kidney disease: Secondary | ICD-10-CM | POA: Diagnosis present

## 2017-09-06 DIAGNOSIS — I2510 Atherosclerotic heart disease of native coronary artery without angina pectoris: Secondary | ICD-10-CM | POA: Diagnosis present

## 2017-09-06 DIAGNOSIS — Z978 Presence of other specified devices: Secondary | ICD-10-CM

## 2017-09-06 DIAGNOSIS — E785 Hyperlipidemia, unspecified: Secondary | ICD-10-CM | POA: Diagnosis present

## 2017-09-06 DIAGNOSIS — E1122 Type 2 diabetes mellitus with diabetic chronic kidney disease: Secondary | ICD-10-CM | POA: Diagnosis present

## 2017-09-06 DIAGNOSIS — Z7984 Long term (current) use of oral hypoglycemic drugs: Secondary | ICD-10-CM

## 2017-09-06 DIAGNOSIS — J189 Pneumonia, unspecified organism: Secondary | ICD-10-CM | POA: Diagnosis present

## 2017-09-06 DIAGNOSIS — Z79899 Other long term (current) drug therapy: Secondary | ICD-10-CM | POA: Diagnosis not present

## 2017-09-06 DIAGNOSIS — E11649 Type 2 diabetes mellitus with hypoglycemia without coma: Secondary | ICD-10-CM | POA: Diagnosis present

## 2017-09-06 DIAGNOSIS — G931 Anoxic brain damage, not elsewhere classified: Secondary | ICD-10-CM | POA: Diagnosis present

## 2017-09-06 DIAGNOSIS — J9602 Acute respiratory failure with hypercapnia: Secondary | ICD-10-CM | POA: Diagnosis present

## 2017-09-06 DIAGNOSIS — I469 Cardiac arrest, cause unspecified: Secondary | ICD-10-CM | POA: Diagnosis present

## 2017-09-06 DIAGNOSIS — J9690 Respiratory failure, unspecified, unspecified whether with hypoxia or hypercapnia: Secondary | ICD-10-CM

## 2017-09-06 DIAGNOSIS — N184 Chronic kidney disease, stage 4 (severe): Secondary | ICD-10-CM | POA: Diagnosis present

## 2017-09-06 DIAGNOSIS — I428 Other cardiomyopathies: Secondary | ICD-10-CM | POA: Diagnosis present

## 2017-09-06 DIAGNOSIS — E876 Hypokalemia: Secondary | ICD-10-CM | POA: Diagnosis present

## 2017-09-06 DIAGNOSIS — R4182 Altered mental status, unspecified: Secondary | ICD-10-CM | POA: Diagnosis present

## 2017-09-06 LAB — BLOOD GAS, ARTERIAL
Acid-base deficit: 7.5 mmol/L — ABNORMAL HIGH (ref 0.0–2.0)
BICARBONATE: 17.5 mmol/L — AB (ref 20.0–28.0)
Drawn by: 51155
FIO2: 60
LHR: 24 {breaths}/min
MECHVT: 500 mL
O2 SAT: 99 %
PATIENT TEMPERATURE: 98.6
PEEP/CPAP: 5 cmH2O
PH ART: 7.318 — AB (ref 7.350–7.450)
PO2 ART: 229 mmHg — AB (ref 83.0–108.0)
pCO2 arterial: 35.1 mmHg (ref 32.0–48.0)

## 2017-09-06 LAB — COMPREHENSIVE METABOLIC PANEL
ALT: 29 U/L (ref 17–63)
AST: 41 U/L (ref 15–41)
Albumin: 2.3 g/dL — ABNORMAL LOW (ref 3.5–5.0)
Alkaline Phosphatase: 313 U/L — ABNORMAL HIGH (ref 38–126)
Anion gap: 16 — ABNORMAL HIGH (ref 5–15)
BUN: 35 mg/dL — AB (ref 6–20)
CHLORIDE: 106 mmol/L (ref 101–111)
CO2: 18 mmol/L — AB (ref 22–32)
CREATININE: 2.32 mg/dL — AB (ref 0.61–1.24)
Calcium: 9.1 mg/dL (ref 8.9–10.3)
GFR calc Af Amer: 36 mL/min — ABNORMAL LOW (ref >60)
GFR, EST NON AFRICAN AMERICAN: 31 mL/min — AB (ref >60)
GLUCOSE: 101 mg/dL — AB (ref 65–99)
Potassium: 4.1 mmol/L (ref 3.5–5.1)
SODIUM: 140 mmol/L (ref 135–145)
Total Bilirubin: 0.8 mg/dL (ref 0.3–1.2)
Total Protein: 6.3 g/dL — ABNORMAL LOW (ref 6.5–8.1)

## 2017-09-06 LAB — ACETAMINOPHEN LEVEL: Acetaminophen (Tylenol), Serum: <10 ug/mL — ABNORMAL LOW (ref 10–30)

## 2017-09-06 LAB — CBG MONITORING, ED
GLUCOSE-CAPILLARY: 96 mg/dL (ref 65–99)
GLUCOSE-CAPILLARY: 80 mg/dL (ref 65–99)

## 2017-09-06 LAB — I-STAT CHEM 8, ED
BUN: 36 mg/dL — ABNORMAL HIGH (ref 6–20)
CHLORIDE: 107 mmol/L (ref 101–111)
Calcium, Ion: 1.2 mmol/L (ref 1.15–1.40)
Creatinine, Ser: 2.1 mg/dL — ABNORMAL HIGH (ref 0.61–1.24)
Glucose, Bld: 96 mg/dL (ref 65–99)
HCT: 42 % (ref 39.0–52.0)
Hemoglobin: 14.3 g/dL (ref 13.0–17.0)
POTASSIUM: 4.1 mmol/L (ref 3.5–5.1)
Sodium: 141 mmol/L (ref 135–145)
TCO2: 19 mmol/L — AB (ref 22–32)

## 2017-09-06 LAB — TROPONIN I: Troponin I: 0.62 ng/mL (ref <0.03)

## 2017-09-06 LAB — I-STAT ARTERIAL BLOOD GAS, ED
ACID-BASE DEFICIT: 14 mmol/L — AB (ref 0.0–2.0)
Acid-base deficit: 11 mmol/L — ABNORMAL HIGH (ref 0.0–2.0)
BICARBONATE: 14.2 mmol/L — AB (ref 20.0–28.0)
Bicarbonate: 15.8 mmol/L — ABNORMAL LOW (ref 20.0–28.0)
O2 Saturation: 100 %
O2 Saturation: 100 %
PCO2 ART: 37.2 mmHg (ref 32.0–48.0)
PO2 ART: 275 mmHg — AB (ref 83.0–108.0)
Patient temperature: 97.6
TCO2: 17 mmol/L — AB (ref 22–32)
TCO2: 15 mmol/L — AB (ref 22–32)
pCO2 arterial: 38.9 mmHg (ref 32.0–48.0)
pH, Arterial: 7.235 — ABNORMAL LOW (ref 7.350–7.450)
pH, Arterial: 7.163 — CL (ref 7.350–7.450)
pO2, Arterial: 408 mmHg — ABNORMAL HIGH (ref 83.0–108.0)

## 2017-09-06 LAB — RAPID URINE DRUG SCREEN, HOSP PERFORMED
AMPHETAMINES: NOT DETECTED
BARBITURATES: NOT DETECTED
BENZODIAZEPINES: NOT DETECTED
COCAINE: NOT DETECTED
Opiates: NOT DETECTED
Tetrahydrocannabinol: NOT DETECTED

## 2017-09-06 LAB — BASIC METABOLIC PANEL
ANION GAP: 8 (ref 5–15)
BUN: 35 mg/dL — ABNORMAL HIGH (ref 6–20)
CALCIUM: 8.6 mg/dL — AB (ref 8.9–10.3)
CO2: 17 mmol/L — AB (ref 22–32)
Chloride: 110 mmol/L (ref 101–111)
Creatinine, Ser: 2.3 mg/dL — ABNORMAL HIGH (ref 0.61–1.24)
GFR calc non Af Amer: 31 mL/min — ABNORMAL LOW (ref >60)
GFR, EST AFRICAN AMERICAN: 36 mL/min — AB (ref >60)
Glucose, Bld: 100 mg/dL — ABNORMAL HIGH (ref 65–99)
POTASSIUM: 4 mmol/L (ref 3.5–5.1)
Sodium: 135 mmol/L (ref 135–145)

## 2017-09-06 LAB — URINALYSIS, ROUTINE W REFLEX MICROSCOPIC
BACTERIA UA: NONE SEEN
Bilirubin Urine: NEGATIVE
Glucose, UA: NEGATIVE mg/dL
KETONES UR: NEGATIVE mg/dL
Leukocytes, UA: NEGATIVE
Nitrite: NEGATIVE
PH: 6 (ref 5.0–8.0)
Protein, ur: >=300 mg/dL — AB
Specific Gravity, Urine: 1.005 (ref 1.005–1.030)

## 2017-09-06 LAB — POCT I-STAT, CHEM 8
BUN: 28 mg/dL — ABNORMAL HIGH (ref 6–20)
BUN: 31 mg/dL — AB (ref 6–20)
CHLORIDE: 109 mmol/L (ref 101–111)
Calcium, Ion: 1.07 mmol/L — ABNORMAL LOW (ref 1.15–1.40)
Calcium, Ion: 1.18 mmol/L (ref 1.15–1.40)
Chloride: 115 mmol/L — ABNORMAL HIGH (ref 101–111)
Creatinine, Ser: 1.7 mg/dL — ABNORMAL HIGH (ref 0.61–1.24)
Creatinine, Ser: 2.2 mg/dL — ABNORMAL HIGH (ref 0.61–1.24)
GLUCOSE: 103 mg/dL — AB (ref 65–99)
Glucose, Bld: 90 mg/dL (ref 65–99)
HEMATOCRIT: 30 % — AB (ref 39.0–52.0)
HEMATOCRIT: 32 % — AB (ref 39.0–52.0)
HEMOGLOBIN: 10.2 g/dL — AB (ref 13.0–17.0)
Hemoglobin: 10.9 g/dL — ABNORMAL LOW (ref 13.0–17.0)
POTASSIUM: 3.3 mmol/L — AB (ref 3.5–5.1)
Potassium: 3.4 mmol/L — ABNORMAL LOW (ref 3.5–5.1)
SODIUM: 144 mmol/L (ref 135–145)
SODIUM: 141 mmol/L (ref 135–145)
TCO2: 17 mmol/L — AB (ref 22–32)
TCO2: 22 mmol/L (ref 22–32)

## 2017-09-06 LAB — GLUCOSE, CAPILLARY
GLUCOSE-CAPILLARY: 86 mg/dL (ref 65–99)
GLUCOSE-CAPILLARY: 104 mg/dL — AB (ref 65–99)
GLUCOSE-CAPILLARY: 119 mg/dL — AB (ref 65–99)
Glucose-Capillary: 89 mg/dL (ref 65–99)

## 2017-09-06 LAB — I-STAT CG4 LACTIC ACID, ED: LACTIC ACID, VENOUS: 10.48 mmol/L — AB (ref 0.5–1.9)

## 2017-09-06 LAB — MAGNESIUM: MAGNESIUM: 4 mg/dL — AB (ref 1.7–2.4)

## 2017-09-06 LAB — SALICYLATE LEVEL

## 2017-09-06 LAB — PROTIME-INR
INR: 1.18
Prothrombin Time: 14.9 seconds (ref 11.4–15.2)

## 2017-09-06 LAB — I-STAT TROPONIN, ED: Troponin i, poc: 0.2 ng/mL (ref 0.00–0.08)

## 2017-09-06 LAB — ETHANOL: Alcohol, Ethyl (B): <5 mg/dL (ref <5)

## 2017-09-06 LAB — APTT: APTT: 31 s (ref 24–36)

## 2017-09-06 LAB — TSH: TSH: 5.641 u[IU]/mL — ABNORMAL HIGH (ref 0.350–4.500)

## 2017-09-06 LAB — AMMONIA: Ammonia: 110 umol/L — ABNORMAL HIGH (ref 9–35)

## 2017-09-06 LAB — LACTIC ACID, PLASMA: Lactic Acid, Venous: 2.2 mmol/L (ref 0.5–1.9)

## 2017-09-06 MED ORDER — LORAZEPAM 2 MG/ML IJ SOLN: 1.0000 mg | Freq: Once | INTRAMUSCULAR | Status: DC

## 2017-09-06 MED ORDER — MAGNESIUM SULFATE 50 % IJ SOLN
INTRAMUSCULAR | Status: DC | PRN
  Administered 2017-09-06 (×2): 2 g via INTRAVENOUS
  Administered 2017-09-06: 1 g via INTRAVENOUS

## 2017-09-06 MED ORDER — FENTANYL 2500MCG IN NS 250ML (10MCG/ML) PREMIX INFUSION
100.0000 ug/h | INTRAVENOUS | Status: DC
  Administered 2017-09-06: 250 ug/h via INTRAVENOUS
  Administered 2017-09-07 – 2017-09-08 (×3): 200 ug/h via INTRAVENOUS
  Filled 2017-09-06 (×4): qty 250

## 2017-09-06 MED ORDER — PIPERACILLIN-TAZOBACTAM 3.375 G IVPB 30 MIN
3.3750 g | Freq: Once | INTRAVENOUS | Status: AC
  Administered 2017-09-06: 3.375 g via INTRAVENOUS
  Filled 2017-09-06: qty 50

## 2017-09-06 MED ORDER — SUCCINYLCHOLINE CHLORIDE 20 MG/ML IJ SOLN
INTRAMUSCULAR | Status: DC | PRN
  Administered 2017-09-06: 110 mg via INTRAVENOUS

## 2017-09-06 MED ORDER — PROPOFOL 500 MG/50ML IV EMUL
INTRAVENOUS | Status: DC | PRN
  Administered 2017-09-06: 10 ug/kg/min via INTRAVENOUS

## 2017-09-06 MED ORDER — CISATRACURIUM BOLUS VIA INFUSION
0.0500 mg/kg | INTRAVENOUS | Status: DC | PRN
  Filled 2017-09-06: qty 4

## 2017-09-06 MED ORDER — SODIUM BICARBONATE 8.4 % IV SOLN
INTRAVENOUS | Status: DC
  Administered 2017-09-06: 19:00:00 via INTRAVENOUS
  Filled 2017-09-06 (×3): qty 1000

## 2017-09-06 MED ORDER — ASPIRIN 300 MG RE SUPP
300.0000 mg | RECTAL | Status: AC
  Administered 2017-09-06: 300 mg via RECTAL
  Filled 2017-09-06: qty 1

## 2017-09-06 MED ORDER — ETOMIDATE 2 MG/ML IV SOLN
INTRAVENOUS | Status: AC | PRN
  Administered 2017-09-06: 20 mg via INTRAVENOUS

## 2017-09-06 MED ORDER — NALOXONE HCL 2 MG/2ML IJ SOSY
PREFILLED_SYRINGE | INTRAMUSCULAR | Status: AC
  Filled 2017-09-06: qty 2

## 2017-09-06 MED ORDER — FENTANYL BOLUS VIA INFUSION
50.0000 ug | INTRAVENOUS | Status: DC | PRN
  Filled 2017-09-06: qty 50

## 2017-09-06 MED ORDER — PANTOPRAZOLE SODIUM 40 MG IV SOLR
40.0000 mg | Freq: Every day | INTRAVENOUS | Status: DC
  Administered 2017-09-06 – 2017-09-07 (×2): 40 mg via INTRAVENOUS
  Filled 2017-09-06 (×2): qty 40

## 2017-09-06 MED ORDER — VANCOMYCIN HCL 10 G IV SOLR
1250.0000 mg | Freq: Once | INTRAVENOUS | Status: AC
  Administered 2017-09-06: 1250 mg via INTRAVENOUS
  Filled 2017-09-06: qty 1250

## 2017-09-06 MED ORDER — NALOXONE HCL 2 MG/2ML IJ SOSY: 2.0000 mg | PREFILLED_SYRINGE | Freq: Once | INTRAMUSCULAR | Status: DC

## 2017-09-06 MED ORDER — NOREPINEPHRINE BITARTRATE 1 MG/ML IV SOLN
0.0000 ug/min | INTRAVENOUS | Status: DC
  Administered 2017-09-06: 4 ug/min via INTRAVENOUS
  Filled 2017-09-06: qty 4

## 2017-09-06 MED ORDER — ATROPINE SULFATE 1 MG/ML IJ SOLN
INTRAMUSCULAR | Status: DC | PRN
  Administered 2017-09-06 (×2): .5 mg via INTRAVENOUS

## 2017-09-06 MED ORDER — HEPARIN SODIUM (PORCINE) 5000 UNIT/ML IJ SOLN
5000.0000 [IU] | Freq: Three times a day (TID) | INTRAMUSCULAR | Status: DC
  Administered 2017-09-06 – 2017-09-08 (×5): 5000 [IU] via SUBCUTANEOUS
  Filled 2017-09-06 (×5): qty 1

## 2017-09-06 MED ORDER — INSULIN ASPART 100 UNIT/ML ~~LOC~~ SOLN
2.0000 [IU] | SUBCUTANEOUS | Status: DC
  Administered 2017-09-07 (×2): 2 [IU] via SUBCUTANEOUS
  Administered 2017-09-07: 4 [IU] via SUBCUTANEOUS

## 2017-09-06 MED ORDER — PIPERACILLIN-TAZOBACTAM 3.375 G IVPB
3.3750 g | Freq: Three times a day (TID) | INTRAVENOUS | Status: DC
  Administered 2017-09-07 – 2017-09-08 (×5): 3.375 g via INTRAVENOUS
  Filled 2017-09-06 (×7): qty 50

## 2017-09-06 MED ORDER — CALCIUM CHLORIDE 10 % IV SOLN
INTRAVENOUS | Status: DC | PRN
  Administered 2017-09-06: 1 g via INTRAVENOUS

## 2017-09-06 MED ORDER — EPINEPHRINE PF 1 MG/10ML IJ SOSY
PREFILLED_SYRINGE | INTRAMUSCULAR | Status: DC | PRN
  Administered 2017-09-06 (×4): 1 via INTRAVENOUS

## 2017-09-06 MED ORDER — SODIUM CHLORIDE 0.9 % IV SOLN
INTRAVENOUS | Status: DC
  Administered 2017-09-06: 20:00:00 via INTRAVENOUS

## 2017-09-06 MED ORDER — ACETAMINOPHEN 500 MG PO TABS: 1000.0000 mg | ORAL_TABLET | Freq: Once | ORAL | Status: DC

## 2017-09-06 MED ORDER — SODIUM BICARBONATE 8.4 % IV SOLN
INTRAVENOUS | Status: DC | PRN
  Administered 2017-09-06 (×2): 100 meq via INTRAVENOUS

## 2017-09-06 MED ORDER — CHLORHEXIDINE GLUCONATE 0.12% ORAL RINSE (MEDLINE KIT)
15.0000 mL | Freq: Two times a day (BID) | OROMUCOSAL | Status: DC
  Administered 2017-09-06: 15 mL via OROMUCOSAL

## 2017-09-06 MED ORDER — CISATRACURIUM BOLUS VIA INFUSION
0.1000 mg/kg | Freq: Once | INTRAVENOUS | Status: AC
  Administered 2017-09-06: 6.8 mg via INTRAVENOUS
  Filled 2017-09-06: qty 7

## 2017-09-06 MED ORDER — FENTANYL CITRATE (PF) 100 MCG/2ML IJ SOLN: 100.0000 ug | Freq: Once | INTRAMUSCULAR | Status: DC

## 2017-09-06 MED ORDER — ARTIFICIAL TEARS OPHTHALMIC OINT
1.0000 "application " | TOPICAL_OINTMENT | Freq: Three times a day (TID) | OPHTHALMIC | Status: DC
  Administered 2017-09-06 – 2017-09-08 (×5): 1 via OPHTHALMIC
  Filled 2017-09-06: qty 3.5

## 2017-09-06 MED ORDER — MAGNESIUM SULFATE 2 GM/50ML IV SOLN
INTRAVENOUS | Status: AC
  Filled 2017-09-06: qty 50

## 2017-09-06 MED ORDER — NOREPINEPHRINE BITARTRATE 1 MG/ML IV SOLN
0.0000 ug/min | INTRAVENOUS | Status: DC
  Administered 2017-09-07: 8 ug/min via INTRAVENOUS
  Administered 2017-09-08: 48 ug/min via INTRAVENOUS
  Administered 2017-09-08: 29 ug/min via INTRAVENOUS
  Filled 2017-09-06 (×3): qty 16

## 2017-09-06 MED ORDER — SODIUM BICARBONATE 8.4 % IV SOLN
INTRAVENOUS | Status: AC
  Filled 2017-09-06: qty 100

## 2017-09-06 MED ORDER — ORAL CARE MOUTH RINSE
15.0000 mL | Freq: Four times a day (QID) | OROMUCOSAL | Status: DC
  Administered 2017-09-06: 15 mL via OROMUCOSAL

## 2017-09-06 MED ORDER — DEXTROSE 5 % IV SOLN
INTRAVENOUS | Status: DC | PRN
  Administered 2017-09-06: 150 mg via INTRAVENOUS

## 2017-09-06 MED ORDER — PROPOFOL 1000 MG/100ML IV EMUL
25.0000 ug/kg/min | INTRAVENOUS | Status: DC
  Administered 2017-09-06: 50 ug/kg/min via INTRAVENOUS
  Administered 2017-09-06: 30 ug/kg/min via INTRAVENOUS
  Administered 2017-09-07: 50 ug/kg/min via INTRAVENOUS
  Administered 2017-09-07: 40 ug/kg/min via INTRAVENOUS
  Administered 2017-09-07: 50 ug/kg/min via INTRAVENOUS
  Administered 2017-09-07: 40 ug/kg/min via INTRAVENOUS
  Administered 2017-09-07: 50 ug/kg/min via INTRAVENOUS
  Administered 2017-09-08: 40 ug/kg/min via INTRAVENOUS
  Filled 2017-09-06: qty 200
  Filled 2017-09-06 (×6): qty 100

## 2017-09-06 MED ORDER — PROPOFOL 1000 MG/100ML IV EMUL
INTRAVENOUS | Status: AC
  Filled 2017-09-06: qty 100

## 2017-09-06 MED ORDER — VANCOMYCIN HCL 500 MG IV SOLR
500.0000 mg | Freq: Two times a day (BID) | INTRAVENOUS | Status: DC
  Administered 2017-09-07 (×2): 500 mg via INTRAVENOUS
  Filled 2017-09-06 (×3): qty 500

## 2017-09-06 MED ORDER — SODIUM CHLORIDE 0.9 % IV SOLN
1.0000 ug/kg/min | INTRAVENOUS | Status: DC
  Administered 2017-09-06: 1 ug/kg/min via INTRAVENOUS
  Filled 2017-09-06: qty 20

## 2017-09-06 NOTE — ED Notes (Signed)
Pt to ed via [facility] with c/o unresponsive at home, pt had a seizure enroute to ED, full body seizure. On arrival to ED- pt started to seize upon transfer from EMS stretcher to ED stretcher.

## 2017-09-06 NOTE — Procedures (Signed)
Central Venous Catheter Insertion Procedure Note Marcus Whitaker [id number] 1966-11-24  Procedure: Insertion of Central Venous Catheter Indications: Drug and/or fluid administration  Procedure Details Consent: Risks of procedure as well as the alternatives and risks of each were explained to the (patient/caregiver).  Consent for procedure obtained. Time Out: Verified patient identification, verified procedure, site/side was marked, verified correct patient position, special equipment/implants available, medications/allergies/relevent history reviewed, required imaging and test results available.  Performed  Maximum sterile technique was used including antiseptics, cap, gloves, gown, hand hygiene, mask and sheet. Skin prep: Chlorhexidine; local anesthetic administered A antimicrobial bonded/coated triple lumen catheter was placed in the left internal jugular vein using the Seldinger technique.  Evaluation Blood flow good Complications: No apparent complications Patient did tolerate procedure well. Chest X-ray ordered to verify placement.  CXR: pending.  Marcus Whitaker 10/06/17, 6:19 PM

## 2017-09-06 NOTE — Code Documentation (Signed)
Epi gtt increased to 

## 2017-09-06 NOTE — H&P (Signed)
PULMONARY / CRITICAL CARE MEDICINE   Name: Delando Satter MRN: [id number] DOB: [birth year]    ADMISSION DATE:  09/14/2017 CONSULTATION DATE:  9/10  REFERRING MD:  EDP  CHIEF COMPLAINT:  Cardiac arrest  HISTORY OF PRESENT ILLNESS:   51 year old male with no PMH significant for CAD status post suspected recent anterolateral wall MI, non-insulin-dependent T2 DM, CKD stage IV, HLD who presented to ED 9/10 from home unresponsive. Initially he was hypoglycemic and given amp dextrose with improvement in glucose, but he remained unresponsive. He was intubated for airway protection. Shortly after intubation he suffered a VT arrest with brief episodes of ROSC throughout. Finally defibrillated into ST after about 15 mins total arrest time. He was started on epinephrine infusion for shock. PCCM asked to admit.   Of note he was recently admitted for NSTEMI and underwent cardiac cath demonstrating no significant coronary occlusion.   PAST MEDICAL HISTORY :  He  has no past medical history on file.  PAST SURGICAL HISTORY: He  has a past surgical history that includes LEFT HEART CATH AND CORONARY ANGIOGRAPHY (N/A, 08/31/2017).  No Known Allergies  No current facility-administered medications on file prior to encounter.    Current Outpatient Prescriptions on File Prior to Encounter  Medication Sig  . aspirin EC 81 MG tablet Take 81 mg by mouth daily.  Marland Kitchen atorvastatin (LIPITOR) 40 MG tablet Take 1 tablet (40 mg total) by mouth daily at 6 PM.  . fluorometholone (FML) 0.1 % ophthalmic suspension Place 1 drop into both eyes 4 (four) times daily. For 1 month, the 1 drop both eyes twice daily.  Marland Kitchen glimepiride (AMARYL) 1 MG tablet Take 1 tablet (1 mg total) by mouth every morning.  Marland Kitchen levofloxacin (LEVAQUIN) 500 MG tablet Take 1 tablet (500 mg total) by mouth every other day.  . metoprolol succinate (TOPROL-XL) 25 MG 24 hr tablet Take 12.5 mg by mouth daily.  . nitroGLYCERIN (NITROSTAT) 0.4 MG SL tablet Place 0.4  mg under the tongue every 5 (five) minutes as needed for chest pain.   . pantoprazole (PROTONIX) 40 MG tablet Take 40 mg by mouth daily.    FAMILY HISTORY:  His has no family status information on file.    SOCIAL HISTORY: He  reports that he has never smoked. He has never used smokeless tobacco.  REVIEW OF SYSTEMS:   unable  SUBJECTIVE:  unable  VITAL SIGNS: BP (!) 151/127   Pulse 87   Temp (!) 95.9 F (35.5 C)   Resp (!) 35   SpO2 100%   HEMODYNAMICS:    VENTILATOR SETTINGS: Vent Mode: PRVC FiO2 (%):  [50 %-100 %] 50 % Set Rate:  [18 bmp-24 bmp] 24 bmp Vt Set:  [500 mL] 500 mL PEEP:  [5 cmH20] 5 cmH20  INTAKE / OUTPUT: No intake/output data recorded.  PHYSICAL EXAMINATION: General:  Middle aged male on vent Neuro:  Posturing to pain HEENT:  [place]/AT, PERRL, no JVD Cardiovascular:  RRR, no MRG Lungs:  Clear Abdomen:  Soft, non-distended Musculoskeletal:  No acute deformity Skin:  Grossly intact  LABS:  BMET  Recent Labs Lab 08/31/17 0305 09/01/17 0945 09/16/2017 1530 09/26/2017 1542  NA 135 138 140 141  K 4.4 3.9 4.1 4.1  CL 105 105 106 107  CO2 22 24 18*  --   BUN 41* 38* 35* 36*  CREATININE 2.33* 2.50* 2.32* 2.10*  GLUCOSE 102* 150* 101* 96    Electrolytes  Recent Labs Lab 08/31/17 0305 09/01/17 0945  09/21/2017 1530  CALCIUM 8.1* 8.5* 9.1    CBC  Recent Labs Lab 08/31/17 0305 09/01/17 0349 09/18/2017 1542  WBC 7.6 6.8  --   HGB 12.3* 11.9* 14.3  HCT 36.8* 35.7* 42.0  PLT 201 196  --     Coag's  Recent Labs Lab 08/30/17 2144  INR 1.26    Sepsis Markers  Recent Labs Lab 09/10/2017 1542  LATICACIDVEN 10.48*    ABG  Recent Labs Lab 09/14/2017 1538 09/10/2017 1719  PHART 7.235* 7.163*  PCO2ART 37.2 38.9  PO2ART 408.0* 275.0*    Liver Enzymes  Recent Labs Lab 09/07/2017 1530  AST 41  ALT 29  ALKPHOS 313*  BILITOT 0.8  ALBUMIN 2.3*    Cardiac Enzymes  Recent Labs Lab 08/31/17 0305  TROPONINI 0.13*     Glucose  Recent Labs Lab 08/31/17 1639 08/31/17 2122 09/01/17 0738 09/01/17 1141 08/30/2017 1520 09/20/2017 1606  GLUCAP 129* 134* 112* 166* 96 80    Imaging Ct Head Wo Contrast  Result Date: 09/11/2017 CLINICAL DATA:  Altered level of consciousness, unexplained arrest EXAM: CT HEAD WITHOUT CONTRAST TECHNIQUE: Contiguous axial images were obtained from the base of the skull through the vertex without intravenous contrast. COMPARISON:  None FINDINGS: Brain: Normal ventricular morphology. No midline shift or mass effect. Parenchymal calcification within deep LEFT frontal lobe inferior to the frontal horn of the LEFT lateral ventricle, nonspecific. No intracranial hemorrhage, mass lesion or evidence of acute infarction. No extra-axial fluid collections. Few scattered dural calcifications are incidentally noted. Vascular: Unremarkable Skull: Intact Sinuses/Orbits: Clear Other: N/A IMPRESSION: No definite acute intracranial abnormalities. Small nonspecific parenchymal calcification in the inferior LEFT frontal lobe. Electronically Signed   By: Ulyses Southward M.D.   On: 09/21/2017 17:08   Dg Chest Portable 1 View  Result Date: 09/14/2017 CLINICAL DATA:  Fevers and unresponsiveness status post intubation EXAM: PORTABLE CHEST 1 VIEW COMPARISON:  08/29/2017 FINDINGS: Cardiac shadow remains enlarged. Endotracheal tube is now seen in satisfactory position. Mild vascular congestion is noted new from the prior exam which may be in part due to a poor inspiratory effort. No focal confluent infiltrate is seen. No acute bony abnormality is noted. IMPRESSION: Endotracheal tube in satisfactory position. Vascular prominence which may be in part due to poor inspiratory effort. Continued follow-up is recommended. Electronically Signed   By: Alcide Clever M.D.   On: 09/12/2017 16:21     STUDIES:  CT head 9/10 >  No definite acute intracranial abnormalities. Small nonspecific parenchymal calcification in the inferior  LEFT frontal lobe.  CULTURES: Blood 9/10 >  ANTIBIOTICS: Zosyn 9/10 > Vancomycin 9/10 >  SIGNIFICANT EVENTS: 9/5 discharged from NSTEMI related admission.  9/10 admit unresponsive, VT cardiac arrest.   LINES/TUBES: ETT 9/10 > [facility] CVL 9/10 >  DISCUSSION: 51 year old male recently admitted for NSTEMI presenting 9/10 unresponsive, then suffering VT cardiac arrest about 15 mins in duration. Intubated on pressors. PCCm to admit to ICU for TTM 33C protocol.   ASSESSMENT / PLAN:  PULMONARY A: Acute hypercarbic respiratory failure  P:   Full vent support CXR ABG VAP bundle  CARDIOVASCULAR A:  VT cardiac arrest Recent NSTEMI H/o HTN, HLD  P:  Cardiac monitoring in ICU setting TTM protocol 33C Epinephrine infusion to keep MAP > Trend lactic acid, troponin Echo Cardiology to see  RENAL A:   CKD  P:   Follow BMP  GASTROINTESTINAL A:   No acute issues  P:   NPO Protonix for SUP  HEMATOLOGIC A:   No acute issues  P:  Heparin SQ Follow CBC  INFECTIOUS A:   ? HCAP  P:   Continue zosyn, vancomycin  ENDOCRINE A:   DM  P:   CBG monitoring and SSI  NEUROLOGIC A:   Acute anoxic encephalopathy  P:   RASS goal: -5 while on cooling protocol Propofol and fentanyl for sedation Nimbex for NMB EEG pending Will likely need neuro involvement at some point.    FAMILY  - Updates: Niece and nephew updated  - Inter-disciplinary family meet or Palliative Care meeting due by:  9/17   Joneen Roach, AGACNP-BC [facility] Pager [phone] or [phone]  09/11/2017 6:33 PM

## 2017-09-06 NOTE — ED Notes (Addendum)
Ativan 1mg  IV for seizure-- dr Donnald Garre at bedside

## 2017-09-06 NOTE — Code Documentation (Signed)
Epi gtt started at 86mcg/m

## 2017-09-06 NOTE — ED Notes (Signed)
Narcan 2mg  given without response.

## 2017-09-06 NOTE — ED Notes (Signed)
Ativan 1mg IV 

## 2017-09-06 NOTE — Code Documentation (Signed)
Epi gtt stopped.

## 2017-09-06 NOTE — ED Notes (Signed)
Etomidate 20 mg given IV 

## 2017-09-06 NOTE — Progress Notes (Signed)
Chaplain was paged for to escort Spanish speaking family to [facility]. Pt is critual. Chaplain found english speaking family member and let nurse know there is one person that can communicate.   09/11/2017 1900  Clinical Encounter Type  Visited With Family  Visit Type Initial  Referral From Nurse  Spiritual Encounters  Spiritual Needs Emotional  Stress Factors  Family Stress Factors Health changes

## 2017-09-06 NOTE — ED Provider Notes (Signed)
[facility] DEPT Provider Note   CSN: [id number] Arrival date & time: 09/23/2017  1518     History   Chief Complaint Chief Complaint  Patient presents with  . unresponsive    HPI Marcus Whitaker is a 51 y.o. male.  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  Patient was found at home by niece who he lives with and she stated he did not answer questions and was not responsive.  At home his glucose was found to be 25 by EMS, he was given 1 amp of dextrose and improved to 118 upon arrival to the ED.  He was still unresponsive however and unable to answer questions.  His niece only speaks [place]. Level 5 Caveat due to unresponsiveness.   The history is provided by medical records and the EMS personnel. The history is limited by the condition of the patient and a language barrier. A language interpreter was used.    No past medical history on file.  Patient Active Problem List   Diagnosis Date Noted  . Cardiac arrest ([place]) 08/28/2017  . Acute coronary syndrome ([place]) 08/29/2017    Past Surgical History:  Procedure Laterality Date  . LEFT HEART CATH AND CORONARY ANGIOGRAPHY N/A 08/31/2017   Procedure: LEFT HEART CATH AND CORONARY ANGIOGRAPHY;  Surgeon: Charolette Forward, MD;  Location: [facility];  Service: Cardiovascular;  Laterality: N/A;       Home Medications    Prior to Admission medications   Medication Sig Start Date End Date Taking? Authorizing Provider  aspirin EC 81 MG tablet Take 81 mg by mouth daily.    [provider]  atorvastatin (LIPITOR) 40 MG tablet Take 1 tablet (40 mg total) by mouth daily at 6 PM. 09/01/17   Charolette Forward, MD  fluorometholone (FML) 0.1 % ophthalmic suspension Place 1 drop into both eyes 4 (four) times daily. For 1 month, the 1 drop both eyes twice daily. 08/06/17   [provider]  glimepiride (AMARYL) 1 MG tablet Take 1 tablet (1 mg total) by mouth every morning. 09/01/17 09/01/18  Charolette Forward, MD  levofloxacin (LEVAQUIN) 500 MG tablet Take 1 tablet (500 mg total) by mouth every other day. 09/01/17 09-12-17  Charolette Forward, MD  metoprolol succinate (TOPROL-XL) 25 MG 24 hr tablet Take 12.5 mg by mouth daily.    [provider]  nitroGLYCERIN (NITROSTAT) 0.4 MG SL tablet Place 0.4 mg under the tongue every 5 (five) minutes as needed for chest pain.  08/17/17   [provider]  pantoprazole (PROTONIX) 40 MG tablet Take 40 mg by mouth daily. 08/11/17   [provider]    Family History No family history on file.  Social History Social History  Substance Use Topics  . Smoking status: Never Smoker  . Smokeless tobacco: Never Used  . Alcohol use Not on file     Allergies   Patient has no known allergies.   Review of Systems Review of Systems  Unable to perform ROS: Patient unresponsive     Physical Exam Updated Vital Signs BP (!) 151/127   Pulse 87   Temp (!) 95.9 F (35.5 C)   Resp (!) 35   SpO2 100%   Physical Exam  Constitutional: He appears well-nourished. He appears ill. Face mask in place.  HENT:  Head: Normocephalic and atraumatic.  Eyes: Pupils are equal,  round, and reactive to light. Conjunctivae are normal.  Neck: Normal range of motion. Neck supple.  Cardiovascular: Normal rate and regular rhythm.   No murmur heard. Pulmonary/Chest: Effort normal and breath sounds normal. No respiratory distress.  Abdominal: Soft. There is no tenderness.  Musculoskeletal: He exhibits no edema.  Neurological: He is unresponsive. He displays no seizure activity. GCS eye subscore is 2. GCS verbal subscore is 2. GCS motor subscore is 5.  No overt seizure activity noted.  Patient has good tone in his extremities with no obvious deformities.  GCS 9.  Unable to perform neurological exam given his altered mental status and  unresponsiveness.  Skin: Skin is warm and dry. No rash noted. No pallor.  Psychiatric: He has a normal mood and affect.  Nursing note and vitals reviewed.    ED Treatments / Results  Labs (all labs ordered are listed, but only abnormal results are displayed) Labs Reviewed  COMPREHENSIVE METABOLIC PANEL - Abnormal; Notable for the following:       Result Value   CO2 18 (*)    Glucose, Bld 101 (*)    BUN 35 (*)    Creatinine, Ser 2.32 (*)    Total Protein 6.3 (*)    Albumin 2.3 (*)    Alkaline Phosphatase 313 (*)    GFR calc non Af Amer 31 (*)    GFR calc Af Amer 36 (*)    Anion gap 16 (*)    All other components within normal limits  AMMONIA - Abnormal; Notable for the following:    Ammonia 110 (*)    All other components within normal limits  URINALYSIS, ROUTINE W REFLEX MICROSCOPIC - Abnormal; Notable for the following:    Hgb urine dipstick SMALL (*)    Protein, ur >=300 (*)    Squamous Epithelial / LPF 0-5 (*)    All other components within normal limits  I-STAT CG4 LACTIC ACID, ED - Abnormal; Notable for the following:    Lactic Acid, Venous 10.48 (*)    All other components within normal limits  I-STAT TROPONIN, ED - Abnormal; Notable for the following:    Troponin i, poc 0.20 (*)    All other components within normal limits  I-STAT CHEM 8, ED - Abnormal; Notable for the following:    BUN 36 (*)    Creatinine, Ser 2.10 (*)    TCO2 19 (*)    All other components within normal limits  I-STAT ARTERIAL BLOOD GAS, ED - Abnormal; Notable for the following:    pH, Arterial 7.235 (*)    pO2, Arterial 408.0 (*)    Bicarbonate 15.8 (*)    TCO2 17 (*)    Acid-base deficit 11.0 (*)    All other components within normal limits  I-STAT ARTERIAL BLOOD GAS, ED - Abnormal; Notable for the following:    pH, Arterial 7.163 (*)    pO2, Arterial 275.0 (*)    Bicarbonate 14.2 (*)    TCO2 15 (*)    Acid-base deficit 14.0 (*)    All other components within normal limits    CULTURE, BLOOD (ROUTINE X 2)  CULTURE, BLOOD (ROUTINE X 2)  RAPID URINE DRUG SCREEN, HOSP PERFORMED  ETHANOL  ACETAMINOPHEN LEVEL  SALICYLATE LEVEL  MAGNESIUM  TSH  TROPONIN I  TROPONIN I  TROPONIN I  BASIC METABOLIC PANEL  BASIC METABOLIC PANEL  BASIC METABOLIC PANEL  BASIC METABOLIC PANEL  BASIC METABOLIC PANEL  BASIC METABOLIC PANEL  BASIC METABOLIC PANEL  PROTIME-INR  PROTIME-INR  APTT  APTT  BLOOD GAS, ARTERIAL  BLOOD GAS, ARTERIAL  CBC  BLOOD GAS, ARTERIAL  MAGNESIUM  PHOSPHORUS  CBG MONITORING, ED  CBG MONITORING, ED  CBG MONITORING, ED  CBG MONITORING, ED  I-STAT CG4 LACTIC ACID, ED  CBG MONITORING, ED    EKG  EKG Interpretation  Date/Time:  Monday September 06 2017 15:19:11 EDT Ventricular Rate:  60 PR Interval:    QRS Duration: 103 QT Interval:  516 QTC Calculation: 516 R Axis:   -171 Text Interpretation:  Sinus rhythm Atrial premature complex Short PR interval Probable anterolateral infarct, age indeterm Prolonged QT interval no STEMI. new anterior T wave inversion. Confirmed by Charlesetta Shanks [phone]) on 09/07/2017 4:35:32 PM       Radiology Ct Head Wo Contrast  Result Date: 09/01/2017 CLINICAL DATA:  Altered level of consciousness, unexplained arrest EXAM: CT HEAD WITHOUT CONTRAST TECHNIQUE: Contiguous axial images were obtained from the base of the skull through the vertex without intravenous contrast. COMPARISON:  None FINDINGS: Brain: Normal ventricular morphology. No midline shift or mass effect. Parenchymal calcification within deep LEFT frontal lobe inferior to the frontal horn of the LEFT lateral ventricle, nonspecific. No intracranial hemorrhage, mass lesion or evidence of acute infarction. No extra-axial fluid collections. Few scattered dural calcifications are incidentally noted. Vascular: Unremarkable Skull: Intact Sinuses/Orbits: Clear Other: N/A IMPRESSION: No definite acute intracranial abnormalities. Small nonspecific parenchymal  calcification in the inferior LEFT frontal lobe. Electronically Signed   By: Lavonia Dana M.D.   On: 09/01/2017 17:08   Dg Chest Portable 1 View  Result Date: 09/04/2017 CLINICAL DATA:  Fevers and unresponsiveness status post intubation EXAM: PORTABLE CHEST 1 VIEW COMPARISON:  08/29/2017 FINDINGS: Cardiac shadow remains enlarged. Endotracheal tube is now seen in satisfactory position. Mild vascular congestion is noted new from the prior exam which may be in part due to a poor inspiratory effort. No focal confluent infiltrate is seen. No acute bony abnormality is noted. IMPRESSION: Endotracheal tube in satisfactory position. Vascular prominence which may be in part due to poor inspiratory effort. Continued follow-up is recommended. Electronically Signed   By: Inez Catalina M.D.   On: 08/28/2017 16:21    Procedures Procedure Name: Intubation Date/Time: 09/15/2017 6:32 PM Performed by: Aldona Lento Pre-anesthesia Checklist: Emergency Drugs available, Patient identified, Suction available, Patient being monitored and Timeout performed Oxygen Delivery Method: Simple face mask Preoxygenation: Pre-oxygenation with 100% oxygen Induction Type: IV induction and Rapid sequence Ventilation: Mask ventilation without difficulty Laryngoscope Size: Mac and 4 Grade View: Grade I Tube size: 7.5 mm Number of attempts: 1 Airway Equipment and Method: Stylet Placement Confirmation: ETT inserted through vocal cords under direct vision,  CO2 detector,  Positive ETCO2 and Breath sounds checked- equal and bilateral Tube secured with: ETT holder Difficulty Due To: Difficulty was unanticipated Comments: Complications: 3 minutes after patient experienced pulseless V-Tach.     ARTERIAL LINE Date/Time: 08/31/2017 6:35 PM Performed by: Aldona Lento Authorized by: Aldona Lento   Consent:    Consent obtained:  Emergent situation Indications:    Indications: hemodynamic monitoring and multiple ABGs   Pre-procedure  details:    Skin preparation:  2% Chlorhexidine Sedation:    Sedation type:  Deep Anesthesia (see MAR for exact dosages):    Anesthesia method:  None Procedure details:    Location:  R radial   Needle gauge:  20 G   Placement technique:  Seldinger and ultrasound guided   Number of attempts:  2  Transducer: waveform confirmed   Post-procedure details:    Post-procedure:  Biopatch applied and sterile dressing applied   Patient tolerance of procedure:  Tolerated well, no immediate complications   (including critical care time)  Medications Ordered in ED Medications  naloxone (NARCAN) 2 MG/2ML injection (not administered)  naloxone (NARCAN) injection 2 mg (not administered)  acetaminophen (TYLENOL) tablet 1,000 mg (not administered)  etomidate (AMIDATE) injection (20 mg Intravenous Given 09/11/2017 1547)  propofol (DIPRIVAN) 1000 MG/100ML infusion (not administered)  LORazepam (ATIVAN) injection 1 mg (not administered)  LORazepam (ATIVAN) injection 1 mg (not administered)  dextrose 5 % 1,000 mL with sodium bicarbonate 150 mEq infusion (not administered)  sodium bicarbonate 1 mEq/mL injection (not administered)  norepinephrine (LEVOPHED) 4 mg in dextrose 5 % 250 mL (0.016 mg/mL) infusion (not administered)  aspirin suppository 300 mg (not administered)  cisatracurium (NIMBEX) bolus via infusion 6.8 mg (not administered)    And  cisatracurium (NIMBEX) 200 mg in sodium chloride 0.9 % 200 mL (1 mg/mL) infusion (not administered)    And  cisatracurium (NIMBEX) bolus via infusion 3.4 mg (not administered)  artificial tears (LACRILUBE) ophthalmic ointment 1 application (not administered)  heparin injection 5,000 Units (not administered)  0.9 %  sodium chloride infusion (not administered)  fentaNYL (SUBLIMAZE) injection 100 mcg (not administered)  fentaNYL 2557mg in NS 2571m(1034mml) infusion-PREMIX (not administered)  fentaNYL (SUBLIMAZE) bolus via infusion 50 mcg (not administered)    propofol (DIPRIVAN) 1000 MG/100ML infusion (not administered)  pantoprazole (PROTONIX) injection 40 mg (not administered)     Initial Impression / Assessment and Plan / ED Course  I have reviewed the triage vital signs and the nursing notes.  Pertinent labs & imaging results that were available during my care of the patient were reviewed by me and considered in my medical decision making (see chart for details).     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    On initial presentation the patient had relatively unremarkable vital signs.  On room air he was above 90% SaO2, he was placed on facemask by EMS and had good waveform with 100% SaO2. He was GCS of 9 with localizing pain and incompetent simple verbal noises, opened eyes to pain.  His pupils were equal and non-pinpoint.  Initially he was given Narcan 2mg23mth no improvement noted and blood sugar was rechecked and found to be in the 90s.  EKG reviewed and showed new T-wave inversions in anterior leads V2 through V3 compared to prior EKG 6 days ago.  Otherwise no obvious evidence of STEMI or ST depression on initial EKG.  His QTC was 516.  Basic laboratory studies were drawn.  Intubation was planned prior to requiring CT imaging.  Etomidate and succinylcholine used for intubation.  Potassium prior to intubation was 4.1.  Succinylcholine was chosen due to concern for close monitoring for possible seizure activity, along with initial lactate of 10 and patient's presentation of altered mental status with tongue lacerations on physical exam. ABG showed pH 7.22 with bicarbonate 15 and CO2 38 suggestive of metabolic acidosis with poor respiratory compensation and mixed pH deformity.  Intubation was performed without difficulty, see note above. Tube placement was confirmed with bilateral breath sounds  and capnography.  Shortly after intubation V. tach was detected on the monitor and patient had no pulses present.  CPR was immediately begun at 1555  and continue for 28 minutes until ROSC was achieved. During this time patient received multiple doses of epinephrine, 2 doses amiodarone, magnesium, sodium bicarbonate, calcium gluconate, along with multiple rounds of defibrillation. Rhythm alternated between monomorphic and polymorphic ventricular tachycardia with one round of asystole.  Upon achieving ROSC, patient was placed on IV epinephrine drip.  He was given another 2 g of magnesium IV, IV fluids were started and bedside echo was performed which showed long sliding present bilaterally and a small pericardial effusion with no obvious tamponade effect or RV strain suggestive of possible massive PE. Cooling externally was begun with goal of temperature 35 C.  Chest x-ray was performed which confirmed tube placement. Laboratory studies reviewed shortly after patient was stabilized post-CPR. Ammonia 110, troponin 0.2.  Critical care was consulted, cardiology consulted. CT head was performed which did not show any acute intracranial abnormalities. Patient was placed on propofol drip for sedation, Keppra was loaded.  Chart review performed. Patient was recently discharged from hospital on 9/5 for suspicion for ACS.  He was transferred [facility] after EKG showed anterior lateral wall MI with T-wave inversion in the lateral leads and minimally elevated troponin.  He was started on IV heparin and given nitroglycerin.  He had a nuclear stress test which showed large fixed inferior lateral wall infarct with EF of 34%.  He also had a fever and was given Rocephin inpatient for possible infectious etiology.  He was switched to Levaquin for outpatient therapy and underwent left cardiac catheterization which he tolerated well and showed no stenosis greater than 50%.  There is a mid LAD lesion  at 30% stenosis.  Suspected cause of pulseless ventricular tachycardia episode with intermittent polymorphic VT related to either myocardial ischemia or prolonged QT.   Arterial line placed at bedside without complication. Central L IJ placed by Critical care team. Patient monitored closely before transportation upstairs.  Spoke with niece at bedside via interpreter. All questions answered. Patient to be full code until niece discusses his health over the phone with patient's sister who lives in [place].  Final Clinical Impressions(s) / ED Diagnoses   Final diagnoses:  None    New Prescriptions New Prescriptions   No medications on file     Aldona Lento, MD 09/16/2017 Tessie Fass, MD 09/18/2017 Hazle Nordmann    Charlesetta Shanks, MD 09/14/17 1231

## 2017-09-06 NOTE — Code Documentation (Signed)
Ice applied to pt   

## 2017-09-06 NOTE — Progress Notes (Signed)
Chaplain was paged to see the Niece of the patient outside of the trauma room while he was coding.  Chaplain prayed with the niece at her request.  Chaplain call language translation services for doctors to correspond with the niece as the patient was being worked on.  The niece of the patient requested prayer for her strength as she was experiencing a bad headache.  The niece waited for her brother to arrive to provide some relief.

## 2017-09-06 NOTE — Progress Notes (Signed)
CRITICAL VALUE ALERT  Critical Value:  Troponin 0.62  Date & Time Notied: 09/14/2017 @ 2205  Provider Notified: Pola Corn  Orders Received/Actions taken: Consistent with previous results; Continue with current plan of care

## 2017-09-06 NOTE — Progress Notes (Signed)
Pharmacy Antibiotic Note  Marcus Whitaker is a 51 y.o. male admitted on 09/07/2017 with sepsis.  Pharmacy has been consulted for Zosyn and vancomycin dosing. CrCl ~ 40 mL/min. WBC pending.   Plan: -Zosyn 3.375 gm IV Q 8 hours -Vancomycin 1250 mg IV once, then vancomycin 500 mg IV Q 12 hours -Monitor CBC, renal fx, cultures and clinical progress -VT at SS   Height: 5\' 9"  (175.3 cm) IBW/kg (Calculated) : 70.7  Temp (24hrs), Avg:95.9 F (35.5 C), Min:95.7 F (35.4 C), Max:96.1 F (35.6 C)   Recent Labs Lab 08/31/17 0305 09/01/17 0349 09/01/17 0945 2017-09-07 1530 09-07-17 1542  WBC 7.6 6.8  --   --   --   CREATININE 2.33*  --  2.50* 2.32* 2.10*  LATICACIDVEN  --   --   --   --  10.48*    Estimated Creatinine Clearance: 40.7 mL/min (A) (by C-G formula based on SCr of 2.1 mg/dL (H)).    No Known Allergies  Antimicrobials this admission: Vanc 9/10 >>  Zosyn 9/10 >>   Dose adjustments this admission: None  Microbiology results: 9/10 BCx:    Thank you for allowing pharmacy to be a part of this patient's care.  Vinnie Level, PharmD., BCPS Clinical Pharmacist Pager [phone]

## 2017-09-07 ENCOUNTER — Inpatient Hospital Stay (HOSPITAL_COMMUNITY): Payer: BLUE CROSS/BLUE SHIELD

## 2017-09-07 ENCOUNTER — Other Ambulatory Visit: Payer: Self-pay

## 2017-09-07 DIAGNOSIS — I469 Cardiac arrest, cause unspecified: Secondary | ICD-10-CM

## 2017-09-07 LAB — BASIC METABOLIC PANEL
ANION GAP: 10 (ref 5–15)
ANION GAP: 9 (ref 5–15)
Anion gap: 9 (ref 5–15)
Anion gap: 8 (ref 5–15)
Anion gap: 9 (ref 5–15)
Anion gap: 8 (ref 5–15)
BUN: 32 mg/dL — AB (ref 6–20)
BUN: 34 mg/dL — ABNORMAL HIGH (ref 6–20)
BUN: 33 mg/dL — ABNORMAL HIGH (ref 6–20)
BUN: 32 mg/dL — ABNORMAL HIGH (ref 6–20)
BUN: 36 mg/dL — AB (ref 6–20)
BUN: 33 mg/dL — ABNORMAL HIGH (ref 6–20)
CHLORIDE: 106 mmol/L (ref 101–111)
CHLORIDE: 108 mmol/L (ref 101–111)
CHLORIDE: 109 mmol/L (ref 101–111)
CHLORIDE: 111 mmol/L (ref 101–111)
CHLORIDE: 108 mmol/L (ref 101–111)
CHLORIDE: 109 mmol/L (ref 101–111)
CO2: 18 mmol/L — ABNORMAL LOW (ref 22–32)
CO2: 21 mmol/L — ABNORMAL LOW (ref 22–32)
CO2: 23 mmol/L (ref 22–32)
CO2: 24 mmol/L (ref 22–32)
CO2: 24 mmol/L (ref 22–32)
CO2: 25 mmol/L (ref 22–32)
CREATININE: 2.23 mg/dL — AB (ref 0.61–1.24)
CREATININE: 2.34 mg/dL — AB (ref 0.61–1.24)
CREATININE: 2.43 mg/dL — AB (ref 0.61–1.24)
Calcium: 8.5 mg/dL — ABNORMAL LOW (ref 8.9–10.3)
Calcium: 8.6 mg/dL — ABNORMAL LOW (ref 8.9–10.3)
Calcium: 8.6 mg/dL — ABNORMAL LOW (ref 8.9–10.3)
Calcium: 8.7 mg/dL — ABNORMAL LOW (ref 8.9–10.3)
Calcium: 8.8 mg/dL — ABNORMAL LOW (ref 8.9–10.3)
Calcium: 8.8 mg/dL — ABNORMAL LOW (ref 8.9–10.3)
Creatinine, Ser: 2.29 mg/dL — ABNORMAL HIGH (ref 0.61–1.24)
Creatinine, Ser: 2.32 mg/dL — ABNORMAL HIGH (ref 0.61–1.24)
Creatinine, Ser: 2.53 mg/dL — ABNORMAL HIGH (ref 0.61–1.24)
GFR calc Af Amer: 32 mL/min — ABNORMAL LOW (ref >60)
GFR calc Af Amer: 34 mL/min — ABNORMAL LOW (ref >60)
GFR calc Af Amer: 36 mL/min — ABNORMAL LOW (ref >60)
GFR calc Af Amer: 36 mL/min — ABNORMAL LOW (ref >60)
GFR calc Af Amer: 37 mL/min — ABNORMAL LOW (ref >60)
GFR calc Af Amer: 38 mL/min — ABNORMAL LOW (ref >60)
GFR calc non Af Amer: 33 mL/min — ABNORMAL LOW (ref >60)
GFR calc non Af Amer: 31 mL/min — ABNORMAL LOW (ref >60)
GFR calc non Af Amer: 29 mL/min — ABNORMAL LOW (ref >60)
GFR, EST NON AFRICAN AMERICAN: 31 mL/min — AB (ref >60)
GFR, EST NON AFRICAN AMERICAN: 28 mL/min — AB (ref >60)
GFR, EST NON AFRICAN AMERICAN: 32 mL/min — AB (ref >60)
GLUCOSE: 194 mg/dL — AB (ref 65–99)
Glucose, Bld: 114 mg/dL — ABNORMAL HIGH (ref 65–99)
Glucose, Bld: 130 mg/dL — ABNORMAL HIGH (ref 65–99)
Glucose, Bld: 130 mg/dL — ABNORMAL HIGH (ref 65–99)
Glucose, Bld: 138 mg/dL — ABNORMAL HIGH (ref 65–99)
Glucose, Bld: 139 mg/dL — ABNORMAL HIGH (ref 65–99)
POTASSIUM: 2.9 mmol/L — AB (ref 3.5–5.1)
Potassium: 3.1 mmol/L — ABNORMAL LOW (ref 3.5–5.1)
Potassium: 3.2 mmol/L — ABNORMAL LOW (ref 3.5–5.1)
Potassium: 3.3 mmol/L — ABNORMAL LOW (ref 3.5–5.1)
Potassium: 3.5 mmol/L (ref 3.5–5.1)
Potassium: 3.7 mmol/L (ref 3.5–5.1)
SODIUM: 138 mmol/L (ref 135–145)
SODIUM: 139 mmol/L (ref 135–145)
SODIUM: 141 mmol/L (ref 135–145)
SODIUM: 142 mmol/L (ref 135–145)
SODIUM: 142 mmol/L (ref 135–145)
Sodium: 137 mmol/L (ref 135–145)

## 2017-09-07 LAB — GLUCOSE, CAPILLARY
GLUCOSE-CAPILLARY: 115 mg/dL — AB (ref 65–99)
GLUCOSE-CAPILLARY: 122 mg/dL — AB (ref 65–99)
GLUCOSE-CAPILLARY: 126 mg/dL — AB (ref 65–99)
GLUCOSE-CAPILLARY: 134 mg/dL — AB (ref 65–99)
GLUCOSE-CAPILLARY: 200 mg/dL — AB (ref 65–99)
Glucose-Capillary: 129 mg/dL — ABNORMAL HIGH (ref 65–99)
Glucose-Capillary: 127 mg/dL — ABNORMAL HIGH (ref 65–99)
Glucose-Capillary: 135 mg/dL — ABNORMAL HIGH (ref 65–99)
Glucose-Capillary: 142 mg/dL — ABNORMAL HIGH (ref 65–99)
Glucose-Capillary: 150 mg/dL — ABNORMAL HIGH (ref 65–99)
Glucose-Capillary: 238 mg/dL — ABNORMAL HIGH (ref 65–99)
Glucose-Capillary: 99 mg/dL (ref 65–99)
Glucose-Capillary: 88 mg/dL (ref 65–99)
Glucose-Capillary: 97 mg/dL (ref 65–99)

## 2017-09-07 LAB — POCT I-STAT 3, ART BLOOD GAS (G3+)
ACID-BASE EXCESS: 1 mmol/L (ref 0.0–2.0)
BICARBONATE: 25 mmol/L (ref 20.0–28.0)
O2 Saturation: 100 %
PH ART: 7.486 — AB (ref 7.350–7.450)
Patient temperature: 33
TCO2: 26 mmol/L (ref 22–32)
pCO2 arterial: 31.9 mmHg — ABNORMAL LOW (ref 32.0–48.0)
pO2, Arterial: 179 mmHg — ABNORMAL HIGH (ref 83.0–108.0)

## 2017-09-07 LAB — POCT I-STAT, CHEM 8
BUN: 34 mg/dL — ABNORMAL HIGH (ref 6–20)
CALCIUM ION: 1.2 mmol/L (ref 1.15–1.40)
CHLORIDE: 109 mmol/L (ref 101–111)
Creatinine, Ser: 2.1 mg/dL — ABNORMAL HIGH (ref 0.61–1.24)
GLUCOSE: 136 mg/dL — AB (ref 65–99)
HCT: 31 % — ABNORMAL LOW (ref 39.0–52.0)
Hemoglobin: 10.5 g/dL — ABNORMAL LOW (ref 13.0–17.0)
Potassium: 3 mmol/L — ABNORMAL LOW (ref 3.5–5.1)
Sodium: 140 mmol/L (ref 135–145)
TCO2: 24 mmol/L (ref 22–32)

## 2017-09-07 LAB — BLOOD GAS, ARTERIAL
ACID-BASE DEFICIT: 0.1 mmol/L (ref 0.0–2.0)
BICARBONATE: 22.4 mmol/L (ref 20.0–28.0)
FIO2: 40
LHR: 24 {breaths}/min
O2 SAT: 99 %
PEEP/CPAP: 5 cmH2O
Patient temperature: 98.6
VT: 500 mL
pCO2 arterial: 26.8 mmHg — ABNORMAL LOW (ref 32.0–48.0)
pH, Arterial: 7.532 — ABNORMAL HIGH (ref 7.350–7.450)
pO2, Arterial: 148 mmHg — ABNORMAL HIGH (ref 83.0–108.0)

## 2017-09-07 LAB — APTT: APTT: 33 s (ref 24–36)

## 2017-09-07 LAB — CBC
HCT: 35.3 % — ABNORMAL LOW (ref 39.0–52.0)
Hemoglobin: 12.4 g/dL — ABNORMAL LOW (ref 13.0–17.0)
MCH: 30.8 pg (ref 26.0–34.0)
MCHC: 35.1 g/dL (ref 30.0–36.0)
MCV: 87.8 fL (ref 78.0–100.0)
PLATELETS: 223 10*3/uL (ref 150–400)
RBC: 4.02 MIL/uL — AB (ref 4.22–5.81)
RDW: 13.9 % (ref 11.5–15.5)
WBC: 12.3 10*3/uL — AB (ref 4.0–10.5)

## 2017-09-07 LAB — TROPONIN I
TROPONIN I: 9.84 ng/mL — AB (ref <0.03)
TROPONIN I: 2.76 ng/mL — AB (ref <0.03)
Troponin I: 8.85 ng/mL (ref <0.03)
Troponin I: 13.74 ng/mL (ref <0.03)
Troponin I: 12.23 ng/mL (ref <0.03)

## 2017-09-07 LAB — ECHOCARDIOGRAM COMPLETE
HEIGHTINCHES: 69 in
Weight: 2515.36 oz

## 2017-09-07 LAB — PHOSPHORUS
Phosphorus: 1.6 mg/dL — ABNORMAL LOW (ref 2.5–4.6)
Phosphorus: 3 mg/dL (ref 2.5–4.6)
Phosphorus: 4.4 mg/dL (ref 2.5–4.6)

## 2017-09-07 LAB — PROTIME-INR
INR: 1.13
PROTHROMBIN TIME: 14.4 s (ref 11.4–15.2)

## 2017-09-07 LAB — MAGNESIUM
MAGNESIUM: 3.3 mg/dL — AB (ref 1.7–2.4)
MAGNESIUM: 3.1 mg/dL — AB (ref 1.7–2.4)
Magnesium: 3.1 mg/dL — ABNORMAL HIGH (ref 1.7–2.4)

## 2017-09-07 MED ORDER — ORAL CARE MOUTH RINSE
15.0000 mL | OROMUCOSAL | Status: DC
  Administered 2017-09-07 – 2017-09-08 (×13): 15 mL via OROMUCOSAL

## 2017-09-07 MED ORDER — INSULIN GLARGINE 100 UNIT/ML ~~LOC~~ SOLN
10.0000 [IU] | Freq: Every day | SUBCUTANEOUS | Status: DC
  Filled 2017-09-07: qty 0.1

## 2017-09-07 MED ORDER — SODIUM BICARBONATE 8.4 % IV SOLN
INTRAVENOUS | Status: DC
  Administered 2017-09-07: 05:00:00 via INTRAVENOUS
  Filled 2017-09-07 (×2): qty 850

## 2017-09-07 MED ORDER — INSULIN ASPART 100 UNIT/ML ~~LOC~~ SOLN: 2.0000 [IU] | SUBCUTANEOUS | Status: DC | PRN

## 2017-09-07 MED ORDER — INSULIN ASPART 100 UNIT/ML ~~LOC~~ SOLN
1.0000 [IU] | SUBCUTANEOUS | Status: DC
  Administered 2017-09-07 – 2017-09-08 (×3): 1 [IU] via SUBCUTANEOUS

## 2017-09-07 MED ORDER — POTASSIUM CHLORIDE 20 MEQ PO PACK
40.0000 meq | PACK | Freq: Once | ORAL | Status: AC
  Administered 2017-09-07: 40 meq via ORAL
  Filled 2017-09-07: qty 2

## 2017-09-07 MED ORDER — VITAL HIGH PROTEIN PO LIQD
1000.0000 mL | ORAL | Status: DC
  Administered 2017-09-07: 1000 mL

## 2017-09-07 MED ORDER — PRO-STAT SUGAR FREE PO LIQD: 30.0000 mL | Freq: Three times a day (TID) | ORAL | Status: DC

## 2017-09-07 MED ORDER — POTASSIUM CHLORIDE 10 MEQ/50ML IV SOLN
10.0000 meq | INTRAVENOUS | Status: AC
  Administered 2017-09-07 (×2): 10 meq via INTRAVENOUS
  Filled 2017-09-07 (×2): qty 50

## 2017-09-07 MED ORDER — CHLORHEXIDINE GLUCONATE 0.12% ORAL RINSE (MEDLINE KIT)
15.0000 mL | Freq: Two times a day (BID) | OROMUCOSAL | Status: DC
  Administered 2017-09-07 – 2017-09-08 (×3): 15 mL via OROMUCOSAL

## 2017-09-07 MED ORDER — POTASSIUM PHOSPHATES 15 MMOLE/5ML IV SOLN
10.0000 mmol | Freq: Once | INTRAVENOUS | Status: AC
  Administered 2017-09-07: 10 mmol via INTRAVENOUS
  Filled 2017-09-07: qty 3.33

## 2017-09-07 MED ORDER — VITAL HIGH PROTEIN PO LIQD
1000.0000 mL | ORAL | Status: DC
  Administered 2017-09-07: 1000 mL
  Administered 2017-09-08 (×2)

## 2017-09-07 MED ORDER — PRO-STAT SUGAR FREE PO LIQD
30.0000 mL | Freq: Two times a day (BID) | ORAL | Status: DC
  Administered 2017-09-07: 30 mL
  Filled 2017-09-07: qty 30

## 2017-09-07 MED FILL — Medication: Qty: 1 | Status: AC

## 2017-09-07 NOTE — Progress Notes (Signed)
EEG Completed; Results Pending  

## 2017-09-07 NOTE — Progress Notes (Signed)
CRITICAL VALUE ALERT  Critical Value:  Troponin 2.76  Date & Time Notied:  09/07/2017 @ 0050  Provider Notified: Pola Corn  Orders Received/Actions taken: Results consistent with previous results. Continue currently plan of care.

## 2017-09-07 NOTE — Progress Notes (Signed)
eLink Physician-Brief Progress Note Patient Name: Marcus Whitaker DOB: [birth year] MRN: [id number]   Date of Service  09/07/2017  HPI/Events of Note  K and Ph low  eICU Interventions  Replace both     Intervention Category Major Interventions: Other:  Tangia Pinard 09/07/2017, 5:10 AM

## 2017-09-07 NOTE — Progress Notes (Signed)
CRITICAL VALUE ALERT  Critical Value:  Troponin 8.85  Date & Time Notied:  09/07/2017 @ 0516  Orders Received/Actions taken: Consistent with previous results.  Continue with current plan of care.

## 2017-09-07 NOTE — Progress Notes (Signed)
eLink Physician-Brief Progress Note Patient Name: Tuf Latona DOB: 11-16-1966 MRN: [id number]   Date of Service  09/07/2017  HPI/Events of Note  FSBS increasing  eICU Interventions  SSI ordered     Intervention Category Evaluation Type: Other  Erin Fulling 09/07/2017, 8:41 PM

## 2017-09-07 NOTE — Consult Note (Signed)
Reason for Consult: VT cardiac arrest/elevated troponin I Referring Physician: CCM  Marcus Whitaker is an 51 y.o. male.  HPI: Patient is 50 year old Hispanic male with past medical history significant for nonobstructive coronary artery disease, hypertension, diabetes mellitus, chronic kidney disease stage IV, GERD, cholelithiasis/status post cholecystitis recently discharged from the Hospital admitted with vague chest pain and was noted to have minimally elevated troponin I and markedly abnormal EKG subsequently required cardiac catheterization which showed nonobstructive CAD patient recently had nuclear stress test which showed inferolateral wall scar with EF of 34% 2-D echo done recently showed an EF approximately 40-45%. Was admitted yesterday as patient was found unresponsive at home and was noted to have blood sugar of 20s received D50 with improvement in his blood sugar and was transferred to the ED. Patient remained unresponsive in the ED requiring intubation subsequently after intubation patient had sustained V. tach ACLS protocol was followed with spontaneous return of circulation and 28 minutes patient received multiple doses of epinephrine and 2 doses of amiodarone magnesium bicarbonate actual gluconate along with multiple rounds of defibrillation. Patient was noted to have elevated troponin I this a.m. EKG showed normal sinus rhythm with prolonged QTC septal Q waves and ST-T wave changes in anterolateral leads as before no new acute ischemic changes are noted. 2-D echo being done now showed no significant new changes as compared to the echo done recently.  No past medical history on file.  Past Surgical History:  Procedure Laterality Date  . LEFT HEART CATH AND CORONARY ANGIOGRAPHY N/A 08/31/2017   Procedure: LEFT HEART CATH AND CORONARY ANGIOGRAPHY;  Surgeon: Charolette Forward, MD;  Location: [facility];  Service: Cardiovascular;  Laterality: N/A;    No family history on file.  Social  History:  reports that he has never smoked. He has never used smokeless tobacco. His alcohol and drug histories are not on file.  Allergies: No Known Allergies  Medications: I have reviewed the patient's current medications.  Results for orders placed or performed during the hospital encounter of 09/24/2017 (from the past 48 hour(s))  CBG monitoring, ED     Status: None   Collection Time: 09/15/2017  3:20 PM  Result Value Ref Range   Glucose-Capillary 96 65 - 99 mg/dL  Comprehensive metabolic panel     Status: Abnormal   Collection Time: 09/14/2017  3:30 PM  Result Value Ref Range   Sodium 140 135 - 145 mmol/L   Potassium 4.1 3.5 - 5.1 mmol/L   Chloride 106 101 - 111 mmol/L   CO2 18 (L) 22 - 32 mmol/L   Glucose, Bld 101 (H) 65 - 99 mg/dL   BUN 35 (H) 6 - 20 mg/dL   Creatinine, Ser 2.32 (H) 0.61 - 1.24 mg/dL   Calcium 9.1 8.9 - 10.3 mg/dL   Total Protein 6.3 (L) 6.5 - 8.1 g/dL   Albumin 2.3 (L) 3.5 - 5.0 g/dL   AST 41 15 - 41 U/L   ALT 29 17 - 63 U/L   Alkaline Phosphatase 313 (H) 38 - 126 U/L   Total Bilirubin 0.8 0.3 - 1.2 mg/dL   GFR calc non Af Amer 31 (L) >60 mL/min   GFR calc Af Amer 36 (L) >60 mL/min    Comment: (NOTE) The eGFR has been calculated using the CKD EPI equation. This calculation has not been validated in all clinical situations. eGFR's persistently <60 mL/min signify possible Chronic Kidney Disease.    Anion gap 16 (H) 5 - 15  Ammonia     Status: Abnormal   Collection Time: 08/29/2017  3:32 PM  Result Value Ref Range   Ammonia 110 (H) 9 - 35 umol/L  Ethanol     Status: None   Collection Time: 09/12/2017  3:32 PM  Result Value Ref Range   Alcohol, Ethyl (B) <5 <5 mg/dL    Comment:        LOWEST DETECTABLE LIMIT FOR SERUM ALCOHOL IS 5 mg/dL FOR MEDICAL PURPOSES ONLY   I-Stat Arterial Blood Gas, ED - (order at [facility] and [facility] only)     Status: Abnormal   Collection Time: 09/24/2017  3:38 PM  Result Value Ref Range   pH, Arterial 7.235 (L) 7.350 - 7.450   pCO2  arterial 37.2 32.0 - 48.0 mmHg   pO2, Arterial 408.0 (H) 83.0 - 108.0 mmHg   Bicarbonate 15.8 (L) 20.0 - 28.0 mmol/L   TCO2 17 (L) 22 - 32 mmol/L   O2 Saturation 100.0 %   Acid-base deficit 11.0 (H) 0.0 - 2.0 mmol/L   Patient temperature 97.6 F    Collection site RADIAL, ALLEN'S TEST ACCEPTABLE    Sample type ARTERIAL   I-Stat Troponin, ED (not at [facility])     Status: Abnormal   Collection Time: 09/05/2017  3:41 PM  Result Value Ref Range   Troponin i, poc 0.20 (HH) 0.00 - 0.08 ng/mL   Comment NOTIFIED PHYSICIAN    Comment 3            Comment: Due to the release kinetics of cTnI, a negative result within the first hours of the onset of symptoms does not rule out myocardial infarction with certainty. If myocardial infarction is still suspected, repeat the test at appropriate intervals.   I-Stat CG4 Lactic Acid, ED     Status: Abnormal   Collection Time: 09/14/2017  3:42 PM  Result Value Ref Range   Lactic Acid, Venous 10.48 (HH) 0.5 - 1.9 mmol/L   Comment NOTIFIED PHYSICIAN   I-Stat Chem 8, ED     Status: Abnormal   Collection Time: 09/22/2017  3:42 PM  Result Value Ref Range   Sodium 141 135 - 145 mmol/L   Potassium 4.1 3.5 - 5.1 mmol/L   Chloride 107 101 - 111 mmol/L   BUN 36 (H) 6 - 20 mg/dL   Creatinine, Ser 2.10 (H) 0.61 - 1.24 mg/dL   Glucose, Bld 96 65 - 99 mg/dL   Calcium, Ion 1.20 1.15 - 1.40 mmol/L   TCO2 19 (L) 22 - 32 mmol/L   Hemoglobin 14.3 13.0 - 17.0 g/dL   HCT 42.0 39.0 - 52.0 %  CBG monitoring, ED     Status: None   Collection Time: 08/31/2017  4:06 PM  Result Value Ref Range   Glucose-Capillary 80 65 - 99 mg/dL  Urinalysis, Routine w reflex microscopic     Status: Abnormal   Collection Time: 09/04/2017  4:32 PM  Result Value Ref Range   Color, Urine YELLOW YELLOW   APPearance CLEAR CLEAR   Specific Gravity, Urine 1.005 1.005 - 1.030   pH 6.0 5.0 - 8.0   Glucose, UA NEGATIVE NEGATIVE mg/dL   Hgb urine dipstick SMALL (A) NEGATIVE   Bilirubin Urine NEGATIVE  NEGATIVE   Ketones, ur NEGATIVE NEGATIVE mg/dL   Protein, ur >=300 (A) NEGATIVE mg/dL   Nitrite NEGATIVE NEGATIVE   Leukocytes, UA NEGATIVE NEGATIVE   RBC / HPF 0-5 0 - 5 RBC/hpf   WBC, UA 0-5 0 - 5  WBC/hpf   Bacteria, UA NONE SEEN NONE SEEN   Squamous Epithelial / LPF 0-5 (A) NONE SEEN  Rapid urine drug screen (hospital performed)     Status: None   Collection Time: 09/17/2017  4:32 PM  Result Value Ref Range   Opiates NONE DETECTED NONE DETECTED   Cocaine NONE DETECTED NONE DETECTED   Benzodiazepines NONE DETECTED NONE DETECTED   Amphetamines NONE DETECTED NONE DETECTED   Tetrahydrocannabinol NONE DETECTED NONE DETECTED   Barbiturates NONE DETECTED NONE DETECTED    Comment:        DRUG SCREEN FOR MEDICAL PURPOSES ONLY.  IF CONFIRMATION IS NEEDED FOR ANY PURPOSE, NOTIFY LAB WITHIN 5 DAYS.        LOWEST DETECTABLE LIMITS FOR URINE DRUG SCREEN Drug Class       Cutoff (ng/mL) Amphetamine      1000 Barbiturate      200 Benzodiazepine   656 Tricyclics       812 Opiates          300 Cocaine          300 THC              50   I-Stat arterial blood gas, ED     Status: Abnormal   Collection Time: 09/12/2017  5:19 PM  Result Value Ref Range   pH, Arterial 7.163 (LL) 7.350 - 7.450   pCO2 arterial 38.9 32.0 - 48.0 mmHg   pO2, Arterial 275.0 (H) 83.0 - 108.0 mmHg   Bicarbonate 14.2 (L) 20.0 - 28.0 mmol/L   TCO2 15 (L) 22 - 32 mmol/L   O2 Saturation 100.0 %   Acid-base deficit 14.0 (H) 0.0 - 2.0 mmol/L   Patient temperature 35.6 C    Collection site RADIAL, ALLEN'S TEST ACCEPTABLE    Sample type ARTERIAL    Comment NOTIFIED PHYSICIAN   Acetaminophen level     Status: Abnormal   Collection Time: 08/28/2017  6:40 PM  Result Value Ref Range   Acetaminophen (Tylenol), Serum <10 (L) 10 - 30 ug/mL    Comment:        THERAPEUTIC CONCENTRATIONS VARY SIGNIFICANTLY. A RANGE OF 10-30 ug/mL MAY BE AN EFFECTIVE CONCENTRATION FOR MANY PATIENTS. HOWEVER, SOME ARE BEST TREATED AT  CONCENTRATIONS OUTSIDE THIS RANGE. ACETAMINOPHEN CONCENTRATIONS >150 ug/mL AT 4 HOURS AFTER INGESTION AND >50 ug/mL AT 12 HOURS AFTER INGESTION ARE OFTEN ASSOCIATED WITH TOXIC REACTIONS.   Salicylate level     Status: None   Collection Time: 09/05/2017  6:40 PM  Result Value Ref Range   Salicylate Lvl <7.5 2.8 - 30.0 mg/dL  Magnesium     Status: Abnormal   Collection Time: 09/19/2017  6:40 PM  Result Value Ref Range   Magnesium 4.0 (H) 1.7 - 2.4 mg/dL  TSH     Status: Abnormal   Collection Time: 09/14/2017  6:40 PM  Result Value Ref Range   TSH 5.641 (H) 0.350 - 4.500 uIU/mL    Comment: Performed by a 3rd Generation assay with a functional sensitivity of <=0.01 uIU/mL.  Arterial Blood Gas     Status: Abnormal   Collection Time: 09/05/2017  7:50 PM  Result Value Ref Range   FIO2 60.00    Delivery systems VENTILATOR    Mode PRESSURE REGULATED VOLUME CONTROL    VT 500 mL   LHR 24 resp/min   Peep/cpap 5.0 cm H20   pH, Arterial 7.318 (L) 7.350 - 7.450   pCO2 arterial 35.1 32.0 - 48.0 mmHg  pO2, Arterial 229 (H) 83.0 - 108.0 mmHg   Bicarbonate 17.5 (L) 20.0 - 28.0 mmol/L   Acid-base deficit 7.5 (H) 0.0 - 2.0 mmol/L   O2 Saturation 99.0 %   Patient temperature 98.6    Collection site A-LINE    Drawn by [phone]    Sample type ARTERIAL DRAW   Glucose, capillary     Status: None   Collection Time: 08/30/2017  7:50 PM  Result Value Ref Range   Glucose-Capillary 86 65 - 99 mg/dL   Comment 1 Arterial Specimen   I-STAT, chem 8     Status: Abnormal   Collection Time: 09/07/2017  7:54 PM  Result Value Ref Range   Sodium 144 135 - 145 mmol/L   Potassium 3.4 (L) 3.5 - 5.1 mmol/L   Chloride 115 (H) 101 - 111 mmol/L   BUN 28 (H) 6 - 20 mg/dL   Creatinine, Ser 1.70 (H) 0.61 - 1.24 mg/dL   Glucose, Bld 90 65 - 99 mg/dL   Calcium, Ion 1.07 (L) 1.15 - 1.40 mmol/L   TCO2 17 (L) 22 - 32 mmol/L   Hemoglobin 10.2 (L) 13.0 - 17.0 g/dL   HCT 30.0 (L) 39.0 - 52.0 %  Troponin I     Status: Abnormal    Collection Time: 09/01/2017  8:01 PM  Result Value Ref Range   Troponin I 0.62 (HH) <0.03 ng/mL    Comment: CRITICAL RESULT CALLED TO, READ BACK BY AND VERIFIED WITH: LANIER,C RN 09/03/2017 2158 JORDANS   Basic metabolic panel     Status: Abnormal   Collection Time: 09/20/2017  8:01 PM  Result Value Ref Range   Sodium 135 135 - 145 mmol/L    Comment: DELTA CHECK NOTED   Potassium 4.0 3.5 - 5.1 mmol/L   Chloride 110 101 - 111 mmol/L   CO2 17 (L) 22 - 32 mmol/L   Glucose, Bld 100 (H) 65 - 99 mg/dL   BUN 35 (H) 6 - 20 mg/dL   Creatinine, Ser 2.30 (H) 0.61 - 1.24 mg/dL   Calcium 8.6 (L) 8.9 - 10.3 mg/dL   GFR calc non Af Amer 31 (L) >60 mL/min   GFR calc Af Amer 36 (L) >60 mL/min    Comment: (NOTE) The eGFR has been calculated using the CKD EPI equation. This calculation has not been validated in all clinical situations. eGFR's persistently <60 mL/min signify possible Chronic Kidney Disease.    Anion gap 8 5 - 15  Protime-INR now and repeat in 8 hours     Status: None   Collection Time: 09/01/2017  8:01 PM  Result Value Ref Range   Prothrombin Time 14.9 11.4 - 15.2 seconds   INR 1.18   APTT now and repeat in 8 hours     Status: None   Collection Time: 09/24/2017  8:01 PM  Result Value Ref Range   aPTT 31 24 - 36 seconds  Lactic acid, plasma     Status: Abnormal   Collection Time: 09/04/2017  8:40 PM  Result Value Ref Range   Lactic Acid, Venous 2.2 (HH) 0.5 - 1.9 mmol/L    Comment: CRITICAL RESULT CALLED TO, READ BACK BY AND VERIFIED WITH: LANIER,K RN 09/19/2017 2020 JORDANS   Glucose, capillary     Status: None   Collection Time: 09/23/2017  8:50 PM  Result Value Ref Range   Glucose-Capillary 89 65 - 99 mg/dL  Glucose, capillary     Status: Abnormal   Collection Time: 09/23/2017 10:02  PM  Result Value Ref Range   Glucose-Capillary 104 (H) 65 - 99 mg/dL  I-STAT, chem 8     Status: Abnormal   Collection Time: 08/31/2017 10:05 PM  Result Value Ref Range   Sodium 141 135 - 145 mmol/L    Potassium 3.3 (L) 3.5 - 5.1 mmol/L   Chloride 109 101 - 111 mmol/L   BUN 31 (H) 6 - 20 mg/dL   Creatinine, Ser 2.20 (H) 0.61 - 1.24 mg/dL   Glucose, Bld 103 (H) 65 - 99 mg/dL   Calcium, Ion 1.18 1.15 - 1.40 mmol/L   TCO2 22 22 - 32 mmol/L   Hemoglobin 10.9 (L) 13.0 - 17.0 g/dL   HCT 32.0 (L) 39.0 - 52.0 %  Glucose, capillary     Status: Abnormal   Collection Time: 09/14/2017 11:07 PM  Result Value Ref Range   Glucose-Capillary 119 (H) 65 - 99 mg/dL   Comment 1 Arterial Specimen   Troponin I     Status: Abnormal   Collection Time: 09/13/2017 11:46 PM  Result Value Ref Range   Troponin I 2.76 (HH) <0.03 ng/mL    Comment: CRITICAL VALUE NOTED.  VALUE IS CONSISTENT WITH PREVIOUSLY REPORTED AND CALLED VALUE.  Basic metabolic panel     Status: Abnormal   Collection Time: 09/15/2017 11:46 PM  Result Value Ref Range   Sodium 137 135 - 145 mmol/L   Potassium 3.1 (L) 3.5 - 5.1 mmol/L   Chloride 109 101 - 111 mmol/L   CO2 18 (L) 22 - 32 mmol/L   Glucose, Bld 130 (H) 65 - 99 mg/dL   BUN 36 (H) 6 - 20 mg/dL   Creatinine, Ser 2.53 (H) 0.61 - 1.24 mg/dL   Calcium 8.5 (L) 8.9 - 10.3 mg/dL   GFR calc non Af Amer 28 (L) >60 mL/min   GFR calc Af Amer 32 (L) >60 mL/min    Comment: (NOTE) The eGFR has been calculated using the CKD EPI equation. This calculation has not been validated in all clinical situations. eGFR's persistently <60 mL/min signify possible Chronic Kidney Disease.    Anion gap 10 5 - 15  Glucose, capillary     Status: Abnormal   Collection Time: 09/05/2017 11:57 PM  Result Value Ref Range   Glucose-Capillary 129 (H) 65 - 99 mg/dL  Glucose, capillary     Status: Abnormal   Collection Time: 09/07/17  1:07 AM  Result Value Ref Range   Glucose-Capillary 126 (H) 65 - 99 mg/dL  Glucose, capillary     Status: Abnormal   Collection Time: 09/07/17  2:09 AM  Result Value Ref Range   Glucose-Capillary 122 (H) 65 - 99 mg/dL  I-STAT, chem 8     Status: Abnormal   Collection Time:  09/07/17  2:11 AM  Result Value Ref Range   Sodium 140 135 - 145 mmol/L   Potassium 3.0 (L) 3.5 - 5.1 mmol/L   Chloride 109 101 - 111 mmol/L   BUN 34 (H) 6 - 20 mg/dL   Creatinine, Ser 2.10 (H) 0.61 - 1.24 mg/dL   Glucose, Bld 136 (H) 65 - 99 mg/dL   Calcium, Ion 1.20 1.15 - 1.40 mmol/L   TCO2 24 22 - 32 mmol/L   Hemoglobin 10.5 (L) 13.0 - 17.0 g/dL   HCT 31.0 (L) 39.0 - 52.0 %  Glucose, capillary     Status: Abnormal   Collection Time: 09/07/17  3:01 AM  Result Value Ref Range   Glucose-Capillary 127 (H)  65 - 99 mg/dL  Troponin I     Status: Abnormal   Collection Time: 09/07/17  3:52 AM  Result Value Ref Range   Troponin I 8.85 (HH) <0.03 ng/mL    Comment: CRITICAL RESULT CALLED TO, READ BACK BY AND VERIFIED WITH: Jethro Bolus [id number] [facility]   Basic metabolic panel     Status: Abnormal   Collection Time: 09/07/17  3:52 AM  Result Value Ref Range   Sodium 138 135 - 145 mmol/L   Potassium 2.9 (L) 3.5 - 5.1 mmol/L   Chloride 108 101 - 111 mmol/L   CO2 21 (L) 22 - 32 mmol/L   Glucose, Bld 139 (H) 65 - 99 mg/dL   BUN 34 (H) 6 - 20 mg/dL   Creatinine, Ser 2.29 (H) 0.61 - 1.24 mg/dL   Calcium 8.6 (L) 8.9 - 10.3 mg/dL   GFR calc non Af Amer 32 (L) >60 mL/min   GFR calc Af Amer 37 (L) >60 mL/min    Comment: (NOTE) The eGFR has been calculated using the CKD EPI equation. This calculation has not been validated in all clinical situations. eGFR's persistently <60 mL/min signify possible Chronic Kidney Disease.    Anion gap 9 5 - 15  Protime-INR now and repeat in 8 hours     Status: None   Collection Time: 09/07/17  3:52 AM  Result Value Ref Range   Prothrombin Time 14.4 11.4 - 15.2 seconds   INR 1.13   APTT now and repeat in 8 hours     Status: None   Collection Time: 09/07/17  3:52 AM  Result Value Ref Range   aPTT 33 24 - 36 seconds  CBC     Status: Abnormal   Collection Time: 09/07/17  3:52 AM  Result Value Ref Range   WBC 12.3 (H) 4.0 - 10.5 K/uL   RBC 4.02 (L)  4.22 - 5.81 MIL/uL   Hemoglobin 12.4 (L) 13.0 - 17.0 g/dL   HCT 35.3 (L) 39.0 - 52.0 %   MCV 87.8 78.0 - 100.0 fL   MCH 30.8 26.0 - 34.0 pg   MCHC 35.1 30.0 - 36.0 g/dL   RDW 13.9 11.5 - 15.5 %   Platelets 223 150 - 400 K/uL  Magnesium     Status: Abnormal   Collection Time: 09/07/17  3:52 AM  Result Value Ref Range   Magnesium 3.3 (H) 1.7 - 2.4 mg/dL  Phosphorus     Status: Abnormal   Collection Time: 09/07/17  3:52 AM  Result Value Ref Range   Phosphorus 1.6 (L) 2.5 - 4.6 mg/dL  Glucose, capillary     Status: Abnormal   Collection Time: 09/07/17  4:02 AM  Result Value Ref Range   Glucose-Capillary 135 (H) 65 - 99 mg/dL  Blood gas, arterial     Status: Abnormal (Preliminary result)   Collection Time: 09/07/17  5:00 AM  Result Value Ref Range   FIO2 40.00    Delivery systems VENTILATOR    Mode PRESSURE REGULATED VOLUME CONTROL    VT 500 mL   LHR 24 resp/min   Peep/cpap 5.0 cm H20   pH, Arterial 7.532 (H) 7.350 - 7.450   pCO2 arterial 26.8 (L) 32.0 - 48.0 mmHg   pO2, Arterial 148 (H) 83.0 - 108.0 mmHg   Bicarbonate 22.4 20.0 - 28.0 mmol/L   Acid-base deficit 0.1 0.0 - 2.0 mmol/L   O2 Saturation 99.0 %   Patient temperature 98.6    Oxygen index  PASS    Collection site A-LINE    Drawn by COLLECTED BY RT    Sample type ARTERIAL DRAW    Allens test (pass/fail) PENDING PASS  Glucose, capillary     Status: Abnormal   Collection Time: 09/07/17  6:37 AM  Result Value Ref Range   Glucose-Capillary 142 (H) 65 - 99 mg/dL  Glucose, capillary     Status: Abnormal   Collection Time: 09/07/17  7:36 AM  Result Value Ref Range   Glucose-Capillary 200 (H) 65 - 99 mg/dL   Comment 1 Arterial Specimen    Comment 2 Notify RN   Basic metabolic panel     Status: Abnormal   Collection Time: 09/07/17  8:15 AM  Result Value Ref Range   Sodium 139 135 - 145 mmol/L   Potassium 3.2 (L) 3.5 - 5.1 mmol/L   Chloride 106 101 - 111 mmol/L   CO2 24 22 - 32 mmol/L   Glucose, Bld 194 (H) 65 - 99  mg/dL   BUN 33 (H) 6 - 20 mg/dL   Creatinine, Ser 2.23 (H) 0.61 - 1.24 mg/dL   Calcium 8.6 (L) 8.9 - 10.3 mg/dL   GFR calc non Af Amer 33 (L) >60 mL/min   GFR calc Af Amer 38 (L) >60 mL/min    Comment: (NOTE) The eGFR has been calculated using the CKD EPI equation. This calculation has not been validated in all clinical situations. eGFR's persistently <60 mL/min signify possible Chronic Kidney Disease.    Anion gap 9 5 - 15  Magnesium     Status: Abnormal   Collection Time: 09/07/17  8:21 AM  Result Value Ref Range   Magnesium 3.1 (H) 1.7 - 2.4 mg/dL  Phosphorus     Status: None   Collection Time: 09/07/17  8:21 AM  Result Value Ref Range   Phosphorus 3.0 2.5 - 4.6 mg/dL  I-STAT 3, arterial blood gas (G3+)     Status: Abnormal   Collection Time: 09/07/17  9:24 AM  Result Value Ref Range   pH, Arterial 7.486 (H) 7.350 - 7.450   pCO2 arterial 31.9 (L) 32.0 - 48.0 mmHg   pO2, Arterial 179.0 (H) 83.0 - 108.0 mmHg   Bicarbonate 25.0 20.0 - 28.0 mmol/L   TCO2 26 22 - 32 mmol/L   O2 Saturation 100.0 %   Acid-Base Excess 1.0 0.0 - 2.0 mmol/L   Patient temperature 33.0 C    Collection site ARTERIAL LINE    Sample type ARTERIAL     Ct Head Wo Contrast  Result Date: 08/28/2017 CLINICAL DATA:  Altered level of consciousness, unexplained arrest EXAM: CT HEAD WITHOUT CONTRAST TECHNIQUE: Contiguous axial images were obtained from the base of the skull through the vertex without intravenous contrast. COMPARISON:  None FINDINGS: Brain: Normal ventricular morphology. No midline shift or mass effect. Parenchymal calcification within deep LEFT frontal lobe inferior to the frontal horn of the LEFT lateral ventricle, nonspecific. No intracranial hemorrhage, mass lesion or evidence of acute infarction. No extra-axial fluid collections. Few scattered dural calcifications are incidentally noted. Vascular: Unremarkable Skull: Intact Sinuses/Orbits: Clear Other: N/A IMPRESSION: No definite acute  intracranial abnormalities. Small nonspecific parenchymal calcification in the inferior LEFT frontal lobe. Electronically Signed   By: Lavonia Dana M.D.   On: 08/31/2017 17:08   Dg Chest Portable 1 View  Result Date: 09/14/2017 CLINICAL DATA:  Central line placement. EXAM: PORTABLE CHEST 1 VIEW COMPARISON:  09/13/2017, earlier the same day FINDINGS: 1755 hours. Endotracheal tube tip  is about 4.6 cm above the base of the carina. The NG tube passes into the stomach although the distal tip position is not included on the film. Left IJ central line is new in the interval with the tip overlying the distal SVC level. No evidence for pneumothorax. The cardio pericardial silhouette is enlarged. Mild vascular congestion without overt edema. No pleural effusion. Telemetry leads overlie the chest. IMPRESSION: Left IJ central line tip overlies the distal SVC. No evidence for pneumothorax. Electronically Signed   By: Misty Stanley M.D.   On: 09/25/2017 18:45   Dg Chest Portable 1 View  Result Date: 09/21/2017 CLINICAL DATA:  Fevers and unresponsiveness status post intubation EXAM: PORTABLE CHEST 1 VIEW COMPARISON:  08/29/2017 FINDINGS: Cardiac shadow remains enlarged. Endotracheal tube is now seen in satisfactory position. Mild vascular congestion is noted new from the prior exam which may be in part due to a poor inspiratory effort. No focal confluent infiltrate is seen. No acute bony abnormality is noted. IMPRESSION: Endotracheal tube in satisfactory position. Vascular prominence which may be in part due to poor inspiratory effort. Continued follow-up is recommended. Electronically Signed   By: Inez Catalina M.D.   On: 09/12/2017 16:21    Review of Systems  Unable to perform ROS: Intubated   Blood pressure 109/72, pulse 72, temperature (!) 91.6 F (33.1 C), temperature source Core (Comment), resp. rate 19, height '5\' 9"'  (1.753 m), weight 71.3 kg (157 lb 3.4 oz), SpO2 100 %. Intubated sedated unresponsive on:  Cooling protocol Physical Exam  Eyes: Conjunctivae are normal. Left eye exhibits no discharge. No scleral icterus.  Neck: Normal range of motion. Neck supple.  Cardiovascular: Normal rate and regular rhythm.  Exam reveals no friction rub.   No murmur heard. Respiratory:  Clear anteriorly  GI: Soft. Bowel sounds are normal. He exhibits no distension. There is no tenderness.  Musculoskeletal:  No clubbing cyanosis 1+ edema    Assessment/Plan: In Hospital V. tach cardiac arrest in the setting of prolonged QTC Mildly elevated troponin I secondary to above Status post hypoglycemic coma Nonobstructive CAD Diabetes mellitus Hyperlipidemia GERD Chronic kidney disease stage IV Plan Continue present management per CCM Hold amiodarone Check serial EKG and cardiac enzymes  Consider EP consultation once patient is extubated and neuro status stable    Jeanell Mangan, Prudencio Burly 09/07/2017, 10:01 AM

## 2017-09-07 NOTE — Procedures (Signed)
ELECTROENCEPHALOGRAM REPORT  Date of Study: 09/07/2017  Patient's Name: Marcus Whitaker MRN: [id number] Date of Birth: 1966/07/15  Referring Provider: Georgann Housekeeper, NP  Clinical History: This is a 51 year old man found unresponsive, s/p cardiac arrest shortly after intubation, on hypothermia protocol  Medications: fentaNYL 2549mg in NS 2514m(1068mml) infusion-PREMIX  propofol (DIPRIVAN) 1000 MG/100ML infusion  acetaminophen (TYLENOL) tablet 1,000 mg  amiodarone (CORDARONE) 150 mg in dextrose 5 % 100 mL bolus  chlorhexidine gluconate (MEDLINE KIT) (PERIDEX) 0.12 % solution 15 mL  cisatracurium (NIMBEX) bolus via infusion 3.4 mg  EPINEPHrine (ADRENALIN) 1 MG/10ML injection  heparin injection 5,000 Units  insulin aspart (novoLOG) injection 2-6 Units  naloxone (NARCAN) injection 2 mg  norepinephrine (LEVOPHED) 16 mg in dextrose 5 % 250 mL (0.064 mg/mL) infusion  pantoprazole (PROTONIX) injection 40 mg  piperacillin-tazobactam (ZOSYN) IVPB 3.375 g  potassium chloride (KLOR-CON) packet 40 mEq  potassium PHOSPHATE 10 mmol in dextrose 5 % 250 mL infusion  vancomycin (VANCOCIN) 500 mg in sodium chloride 0.9 % 100 mL IVPB   Technical Summary: A multichannel digital EEG recording measured by the international 10-20 system with electrodes applied with paste and impedances below 5000 ohms performed in our laboratory with EKG monitoring in an intubated and sedated patient on hypothermia protocol at 33.2 degrees Celsius.  Hyperventilation and photic stimulation were not performed.  The digital EEG was referentially recorded, reformatted, and digitally filtered in a variety of bipolar and referential montages for optimal display.    Description: The patient is intubated and sedated on Propofol and Fentanyl during the recording. There is loss of normal background activity. The background consists of a burst suppression pattern with bursts of diffuse theta admixed with alpha and beta activity lasting  1-6 seconds, in between diffuse background suppression lasting 1-4 seconds. Hyperventilation and photic stimulation were not performed. There were no epileptiform discharges or electrographic seizures seen.   EKG lead was unremarkable.  Impression: This sedated EEG is abnormal due to burst-suppression pattern.  Clinical Correlation: This record shows evidence of diffuse or bilateral cerebral dysfunction that is nonspecific in etiology, and can be seen with anoxic brain injury, anesthetic medications, or severe toxic/metabolic encephalopathy. There were no electrographic seizures in this study. Clinical correlation is advised   KarEllouise Newer.D.

## 2017-09-07 NOTE — Progress Notes (Signed)
Initial Nutrition Assessment  DOCUMENTATION CODES:   Non-severe (moderate) malnutrition in context of chronic illness  INTERVENTION:   -Vital High Protein goal of 35 mL/hour (advance to goal rate on 9/12) - 30 mL Prostat TID    NUTRITION DIAGNOSIS:   Malnutrition (Moderate) related to chronic illness (CAD, CKD, DM) as evidenced by mild depletion of body fat, mild depletion of muscle mass.  GOAL:   Patient will meet greater than or equal to 90% of their needs   MONITOR:   TF tolerance, Labs, Vent status  REASON FOR ASSESSMENT:   Ventilator, Malnutrition Screening Tool, Consult Enteral/tube feeding initiation and management  ASSESSMENT:   Pt with PMH of recent MI, DM, CKD stage IV, HLD. Admitted after cardiac arrest. Started on TTM 33C protocol.   Pt remains intubated. Will start rewarming tonight at 9PM. Discussed plan with RN. CCM started enteral nutrition support, will advance to goal 9/12. MV: 6.7 Temp: 37 C Medications reviewed and include Norepinephrine.  Propofol @ 20.5 mL/hour. 541 kcal per day.  Labs reviewed. K repleted. BUN 32 (H), Cr 2.34 (H).     Diet Order:    NPO  Skin:  Reviewed, no issues  Last BM:  PTA  Height:   Ht Readings from Last 1 Encounters:  09/07/17 5\' 7"  (1.702 m)    Weight:   Wt Readings from Last 1 Encounters:  09/19/2017 157 lb 3.4 oz (71.3 kg)    Ideal Body Weight:  67.3 kg  BMI:  Body mass index is 24.62 kg/m.  Estimated Nutritional Needs:   Kcal:  1,647 kcal  Protein:  100-115 g Protein  Fluid:  1.7-1.9 L  EDUCATION NEEDS:   No education needs identified at this time  Wynetta Emery [facility] Dietetic Intern Pager: [phone] 09/07/2017 4:28 PM

## 2017-09-07 NOTE — Progress Notes (Signed)
Inpatient Diabetes Program Recommendations  AACE/ADA: New Consensus Statement on Inpatient Glycemic Control (2015)  Target Ranges:  Prepandial:   less than 140 mg/dL      Peak postprandial:   less than 180 mg/dL (1-2 hours)      Critically ill patients:  140 - 180 mg/dL   Lab Results  Component Value Date   GLUCAP 150 (H) 09/07/2017   HGBA1C 6.7 (H) 08/29/2017   Review of Glycemic Control  Diabetes history: DM 2 Outpatient Diabetes medications: Amaryl 1 mg Daily Current orders for Inpatient glycemic control: Lantus 10 units QHS  Inpatient Diabetes Program Recommendations:    Due to severity of illness and glucose trends. Please d/c current insulin orders and order the ICU Glycemic Control order set Phase 1 SQ insulin Novolog 2-6 units Q4 hours with ability to utilize IV insulin if needed to improve outcomes. Could also add Tube Feed coverage Q4 if needed.  Thanks,  Christena Deem RN, MSN, [facility] Inpatient Diabetes Coordinator Team Pager [phone] (8a-5p)

## 2017-09-07 NOTE — Progress Notes (Signed)
PULMONARY / CRITICAL CARE MEDICINE   Name: Nikai Quest MRN: [id number] DOB: 07-07-1966    ADMISSION DATE:  09-25-17 CONSULTATION DATE:  9/10  REFERRING MD:  EDP  CHIEF COMPLAINT:  Cardiac arrest  HISTORY OF PRESENT ILLNESS:   51 year old male with no PMH significant for CAD status post suspected recent anterolateral wall MI, non-insulin-dependent T2 DM, CKD stage IV, HLD who presented to ED 9/10 from home unresponsive. Initially he was hypoglycemic and given amp dextrose with improvement in glucose, but he remained unresponsive. He was intubated for airway protection. Shortly after intubation he suffered a VT arrest with brief episodes of ROSC throughout. Finally defibrillated into ST after about 15 mins total arrest time. He was started on epinephrine infusion for shock. PCCM asked to admit.   Of note he was recently admitted for NSTEMI and underwent cardiac cath demonstrating no significant coronary occlusion.   SUBJECTIVE:  No acute events overnight. At goal temp.   VITAL SIGNS: BP 117/67 (BP Location: Left Arm)   Pulse 71   Temp (!) 91.6 F (33.1 C) (Core (Comment))   Resp (!) 24   Ht  (1.753 m)   Wt 71.3 kg (157 lb 3.4 oz)   SpO2 100%   BMI 23.22 kg/m   HEMODYNAMICS: CVP:  [2 mmHg-13 mmHg] 2 mmHg  VENTILATOR SETTINGS: Vent Mode: PRVC FiO2 (%):  [40 %-100 %] 40 % Set Rate:  [18 bmp-24 bmp] 24 bmp Vt Set:  [500 mL-570 mL] 570 mL PEEP:  [5 cmH20] 5 cmH20 Plateau Pressure:  [20 cmH20] 20 cmH20  INTAKE / OUTPUT: I/O last 3 completed shifts: In: 2862.1 [I.V.:2128.7; NG/GT:30; IV Piggyback:703.3] Out: 3220 [Urine:3220]  PHYSICAL EXAMINATION: General:  Middle aged male on vent Neuro:  Posturing to pain HEENT:  [place]/AT, PERRL, no JVD Cardiovascular:  RRR, no MRG Lungs:  Clear Abdomen:  Soft, non-distended Musculoskeletal:  No acute deformity Skin:  Grossly intact  LABS:  BMET  Recent Labs Lab 09/25/2017 2001  2017-09-25 2346 09/07/17 0211 09/07/17 0352   NA 135  < > 137 140 138  K 4.0  < > 3.1* 3.0* 2.9*  CL 110  < > 109 109 108  CO2 17*  --  18*  --  21*  BUN 35*  < > 36* 34* 34*  CREATININE 2.30*  < > 2.53* 2.10* 2.29*  GLUCOSE 100*  < > 130* 136* 139*  < > = values in this interval not displayed.  Electrolytes  Recent Labs Lab 2017-09-25 1840 09/25/2017 2001 September 25, 2017 2346 09/07/17 0352  CALCIUM  --  8.6* 8.5* 8.6*  MG 4.0*  --   --  3.3*  PHOS  --   --   --  1.6*    CBC  Recent Labs Lab 09/01/17 0349  2017/09/25 2205 09/07/17 0211 09/07/17 0352  WBC 6.8  --   --   --  12.3*  HGB 11.9*  < > 10.9* 10.5* 12.4*  HCT 35.7*  < > 32.0* 31.0* 35.3*  PLT 196  --   --   --  223  < > = values in this interval not displayed.  Coag's  Recent Labs Lab Sep 25, 2017 2001 09/07/17 0352  APTT 31 33  INR 1.18 1.13    Sepsis Markers  Recent Labs Lab 2017-09-25 1542 Sep 25, 2017 2040  LATICACIDVEN 10.48* 2.2*    ABG  Recent Labs Lab 09-25-2017 1719 09/25/2017 1950 09/07/17 0500  PHART 7.163* 7.318* 7.532*  PCO2ART 38.9 35.1 26.8*  PO2ART  275.0* 229* 148*    Liver Enzymes  Recent Labs Lab 09/02/2017 1530  AST 41  ALT 29  ALKPHOS 313*  BILITOT 0.8  ALBUMIN 2.3*    Cardiac Enzymes  Recent Labs Lab 09/10/2017 2001 08/31/2017 2346 09/07/17 0352  TROPONINI 0.62* 2.76* 8.85*    Glucose  Recent Labs Lab 09/15/2017 2307 09/14/2017 2357 09/07/17 0107 09/07/17 0209 09/07/17 0301 09/07/17 0402  GLUCAP 119* 129* 126* 122* 127* 135*    Imaging Ct Head Wo Contrast  Result Date: 09/11/2017 CLINICAL DATA:  Altered level of consciousness, unexplained arrest EXAM: CT HEAD WITHOUT CONTRAST TECHNIQUE: Contiguous axial images were obtained from the base of the skull through the vertex without intravenous contrast. COMPARISON:  None FINDINGS: Brain: Normal ventricular morphology. No midline shift or mass effect. Parenchymal calcification within deep LEFT frontal lobe inferior to the frontal horn of the LEFT lateral ventricle,  nonspecific. No intracranial hemorrhage, mass lesion or evidence of acute infarction. No extra-axial fluid collections. Few scattered dural calcifications are incidentally noted. Vascular: Unremarkable Skull: Intact Sinuses/Orbits: Clear Other: N/A IMPRESSION: No definite acute intracranial abnormalities. Small nonspecific parenchymal calcification in the inferior LEFT frontal lobe. Electronically Signed   By: Ulyses Southward M.D.   On: 09/03/2017 17:08   Dg Chest Portable 1 View  Result Date: 08/29/2017 CLINICAL DATA:  Central line placement. EXAM: PORTABLE CHEST 1 VIEW COMPARISON:  09/18/2017, earlier the same day FINDINGS: 1755 hours. Endotracheal tube tip is about 4.6 cm above the base of the carina. The NG tube passes into the stomach although the distal tip position is not included on the film. Left IJ central line is new in the interval with the tip overlying the distal SVC level. No evidence for pneumothorax. The cardio pericardial silhouette is enlarged. Mild vascular congestion without overt edema. No pleural effusion. Telemetry leads overlie the chest. IMPRESSION: Left IJ central line tip overlies the distal SVC. No evidence for pneumothorax. Electronically Signed   By: Kennith Center M.D.   On: 09/25/2017 18:45   Dg Chest Portable 1 View  Result Date: 09/12/2017 CLINICAL DATA:  Fevers and unresponsiveness status post intubation EXAM: PORTABLE CHEST 1 VIEW COMPARISON:  08/29/2017 FINDINGS: Cardiac shadow remains enlarged. Endotracheal tube is now seen in satisfactory position. Mild vascular congestion is noted new from the prior exam which may be in part due to a poor inspiratory effort. No focal confluent infiltrate is seen. No acute bony abnormality is noted. IMPRESSION: Endotracheal tube in satisfactory position. Vascular prominence which may be in part due to poor inspiratory effort. Continued follow-up is recommended. Electronically Signed   By: Alcide Clever M.D.   On: 09/10/2017 16:21      STUDIES:  CT head 9/10 >  No definite acute intracranial abnormalities. Small nonspecific parenchymal calcification in the inferior LEFT frontal lobe.   CULTURES: Blood 9/10 >  ANTIBIOTICS: Zosyn 9/10 > Vancomycin 9/10 >  SIGNIFICANT EVENTS: 9/5 discharged from NSTEMI related admission.  9/10 admit unresponsive, VT cardiac arrest.   LINES/TUBES: ETT 9/10 > [facility] CVL 9/10 >  DISCUSSION: 51 year old male recently admitted for NSTEMI presenting 9/10 unresponsive, then suffering VT cardiac arrest about 15 mins in duration. Intubated on pressors. PCCm to admit to ICU for TTM 33C protocol.   ASSESSMENT / PLAN:  PULMONARY A: Acute hypercarbic respiratory failure  P:   Full vent support CXR ABG reviewed, settings adjusted VAP bundle  CARDIOVASCULAR A:  VT cardiac arrest CAD Non-ischemic cardiomyopathy Recent NSTEMI H/o HTN, HLD  P:  Cardiac monitoring in ICU setting TTM protocol 33C Levophed infusion to keep MAP > Will start rewarm 2100 Echo pending Dr. Sharyn Lull to call back.   RENAL A:   CKD High AG acidosis > improved Hypokalemia Hypophosphatemia   P:   Follow BMP Stop bicarb infusion Supp Kphos  GASTROINTESTINAL A:   No acute issues  P:   NPO Protonix for SUP Start TF  HEMATOLOGIC A:   No acute issues  P:  Heparin SQ Follow CBC  INFECTIOUS A:   ? HCAP  P:   Continue zosyn, vancomycin  ENDOCRINE A:   DM  P:   CBG monitoring and SSI Will add lantus with addition of TF  NEUROLOGIC A:   Acute anoxic encephalopathy  P:   RASS goal: -5 while on cooling protocol Propofol and fentanyl for sedation Nimbex for NMB Will start WUA once rewarmed 9/12 EEG pending Will likely need neuro involvement at some point.    FAMILY  - Updates: Family from [facility] updated, bedside.   - Inter-disciplinary family meet or Palliative Care meeting due by:  9/17   Joneen Roach, AGACNP-BC [facility] Pager  [phone] or [phone]  09/07/2017 7:43 AM

## 2017-09-07 NOTE — Progress Notes (Signed)
Per family member, patient's wife passed a little over a year ago.  Patient's overall demeanor has declined and patient has not been eating well since.  Family concerned pt not coping well and may be suffering from depression.  Depending on outcome of hypothermia protocol, pt may benefit from consult(s) to address such.

## 2017-09-07 NOTE — Progress Notes (Signed)
I engaged the family in the waiting room and recognized them from yesterday.  Marcus Whitaker asked that I pray for her and her uncle and I wanted to let her know I followed through on her request.  The family was grateful and hopeful for the prognosis of their uncle.    The family still knows their uncle is not out of the woods and requests additional pray for healing

## 2017-09-07 NOTE — Progress Notes (Signed)
eLink Physician-Brief Progress Note Patient Name: Marcus Whitaker DOB: 11-22-1966 MRN: [id number]   Date of Service  09/07/2017  HPI/Events of Note  Potassium low.  eICU Interventions  Will give 10 meq IV x 2 runs.     Intervention Category Major Interventions: Other:  Emri Sample 09/07/2017, 12:52 AM

## 2017-09-07 NOTE — Progress Notes (Signed)
  Echocardiogram 2D Echocardiogram has been performed.  Leta Jungling M 09/07/2017, 9:09 AM

## 2017-09-08 ENCOUNTER — Inpatient Hospital Stay (HOSPITAL_COMMUNITY): Payer: BLUE CROSS/BLUE SHIELD

## 2017-09-08 LAB — BASIC METABOLIC PANEL
Anion gap: 9 (ref 5–15)
Anion gap: 9 (ref 5–15)
BUN: 32 mg/dL — ABNORMAL HIGH (ref 6–20)
BUN: 34 mg/dL — AB (ref 6–20)
CHLORIDE: 111 mmol/L (ref 101–111)
CHLORIDE: 111 mmol/L (ref 101–111)
CO2: 20 mmol/L — AB (ref 22–32)
CO2: 22 mmol/L (ref 22–32)
Calcium: 8.1 mg/dL — ABNORMAL LOW (ref 8.9–10.3)
Calcium: 8.5 mg/dL — ABNORMAL LOW (ref 8.9–10.3)
Creatinine, Ser: 2.47 mg/dL — ABNORMAL HIGH (ref 0.61–1.24)
Creatinine, Ser: 2.69 mg/dL — ABNORMAL HIGH (ref 0.61–1.24)
GFR calc Af Amer: 33 mL/min — ABNORMAL LOW (ref >60)
GFR calc non Af Amer: 26 mL/min — ABNORMAL LOW (ref >60)
GFR, EST AFRICAN AMERICAN: 30 mL/min — AB (ref >60)
GFR, EST NON AFRICAN AMERICAN: 29 mL/min — AB (ref >60)
GLUCOSE: 135 mg/dL — AB (ref 65–99)
Glucose, Bld: 123 mg/dL — ABNORMAL HIGH (ref 65–99)
POTASSIUM: 3.9 mmol/L (ref 3.5–5.1)
Potassium: 4.4 mmol/L (ref 3.5–5.1)
SODIUM: 140 mmol/L (ref 135–145)
Sodium: 142 mmol/L (ref 135–145)

## 2017-09-08 LAB — GLUCOSE, CAPILLARY
GLUCOSE-CAPILLARY: 111 mg/dL — AB (ref 65–99)
GLUCOSE-CAPILLARY: 128 mg/dL — AB (ref 65–99)
Glucose-Capillary: 122 mg/dL — ABNORMAL HIGH (ref 65–99)

## 2017-09-08 LAB — COMPREHENSIVE METABOLIC PANEL
ALT: 35 U/L (ref 17–63)
ANION GAP: 8 (ref 5–15)
AST: 79 U/L — ABNORMAL HIGH (ref 15–41)
Albumin: 1.8 g/dL — ABNORMAL LOW (ref 3.5–5.0)
Alkaline Phosphatase: 276 U/L — ABNORMAL HIGH (ref 38–126)
BILIRUBIN TOTAL: 1.4 mg/dL — AB (ref 0.3–1.2)
BUN: 32 mg/dL — ABNORMAL HIGH (ref 6–20)
CO2: 22 mmol/L (ref 22–32)
Calcium: 8.2 mg/dL — ABNORMAL LOW (ref 8.9–10.3)
Chloride: 111 mmol/L (ref 101–111)
Creatinine, Ser: 2.57 mg/dL — ABNORMAL HIGH (ref 0.61–1.24)
GFR calc Af Amer: 32 mL/min — ABNORMAL LOW (ref >60)
GFR, EST NON AFRICAN AMERICAN: 27 mL/min — AB (ref >60)
Glucose, Bld: 117 mg/dL — ABNORMAL HIGH (ref 65–99)
POTASSIUM: 4 mmol/L (ref 3.5–5.1)
Sodium: 141 mmol/L (ref 135–145)
TOTAL PROTEIN: 5.4 g/dL — AB (ref 6.5–8.1)

## 2017-09-08 LAB — CBC
HEMATOCRIT: 43.5 % (ref 39.0–52.0)
Hemoglobin: 14.8 g/dL (ref 13.0–17.0)
MCH: 30.6 pg (ref 26.0–34.0)
MCHC: 34 g/dL (ref 30.0–36.0)
MCV: 90.1 fL (ref 78.0–100.0)
Platelets: 240 10*3/uL (ref 150–400)
RBC: 4.83 MIL/uL (ref 4.22–5.81)
RDW: 14.9 % (ref 11.5–15.5)
WBC: 14.4 10*3/uL — ABNORMAL HIGH (ref 4.0–10.5)

## 2017-09-08 LAB — PROCALCITONIN: Procalcitonin: 1.73 ng/mL

## 2017-09-08 LAB — PHOSPHORUS: PHOSPHORUS: 5.4 mg/dL — AB (ref 2.5–4.6)

## 2017-09-08 LAB — VANCOMYCIN, TROUGH: VANCOMYCIN TR: 33 ug/mL — AB (ref 15–20)

## 2017-09-08 LAB — MAGNESIUM: MAGNESIUM: 2.8 mg/dL — AB (ref 1.7–2.4)

## 2017-09-08 MED ORDER — EPINEPHRINE PF 1 MG/10ML IJ SOSY
PREFILLED_SYRINGE | INTRAMUSCULAR | Status: AC
  Filled 2017-09-08: qty 20

## 2017-09-08 MED ORDER — SODIUM CHLORIDE 0.9% FLUSH: 10.0000 mL | INTRAVENOUS | Status: DC | PRN

## 2017-09-08 MED ORDER — AMIODARONE HCL IN DEXTROSE 360-4.14 MG/200ML-% IV SOLN: 60.0000 mg/h | INTRAVENOUS | Status: AC

## 2017-09-08 MED ORDER — AMIODARONE HCL IN DEXTROSE 360-4.14 MG/200ML-% IV SOLN: 30.0000 mg/h | INTRAVENOUS | Status: DC

## 2017-09-08 MED ORDER — SODIUM BICARBONATE 8.4 % IV SOLN
INTRAVENOUS | Status: AC
  Filled 2017-09-08: qty 100

## 2017-09-08 MED ORDER — PNEUMOCOCCAL VAC POLYVALENT 25 MCG/0.5ML IJ INJ: 0.5000 mL | INJECTION | INTRAMUSCULAR | Status: DC | PRN

## 2017-09-08 MED ORDER — SODIUM CHLORIDE 0.9 % IV BOLUS (SEPSIS)
1000.0000 mL | Freq: Once | INTRAVENOUS | Status: AC
  Administered 2017-09-08: 1000 mL via INTRAVENOUS

## 2017-09-08 MED ORDER — SODIUM CHLORIDE 0.9% FLUSH: 10.0000 mL | Freq: Two times a day (BID) | INTRAVENOUS | Status: DC

## 2017-09-08 MED ORDER — CHLORHEXIDINE GLUCONATE CLOTH 2 % EX PADS
6.0000 | MEDICATED_PAD | Freq: Every day | CUTANEOUS | Status: DC
  Administered 2017-09-08: 6 via TOPICAL

## 2017-09-08 MED ORDER — AMIODARONE HCL IN DEXTROSE 360-4.14 MG/200ML-% IV SOLN
INTRAVENOUS | Status: DC
Start: 2017-09-08 — End: 2017-09-08
  Filled 2017-09-08: qty 200

## 2017-09-08 MED FILL — Medication: Qty: 1 | Status: AC

## 2017-09-11 LAB — CULTURE, BLOOD (ROUTINE X 2)
CULTURE: NO GROWTH
Culture: NO GROWTH
Special Requests: ADEQUATE

## 2017-09-21 ENCOUNTER — Telehealth: Payer: Self-pay

## 2017-09-21 NOTE — Telephone Encounter (Signed)
On 09/21/17 I received a death certificate from [facility] (original). The death certificate is for burial. 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 The death certificate will be taken to Pulmonary Unit @ [facility] tomorrow am for signature.  On October 16, 2017 I received the death certificate back from [facility]. I got the d/c ready and called the funeral home to let them know the d/c is ready for pickup. I also faxed a copy to the funeral home per the funeral home request.

## 2017-09-27 NOTE — Progress Notes (Signed)
Subjective:  Patient remains intubated, sedated on hypothermic protocol and being warmed now  Objective:  Vital Signs in the last 24 hours: Temp:  [90.9 F (32.7 C)-97.5 F (36.4 C)] 97.5 F (36.4 C) (09/12 0900) Pulse Rate:  [72-89] 89 (09/12 0900) Resp:  [12-17] 15 (09/12 0900) BP: (91-118)/(57-71) 118/63 (09/12 0900) SpO2:  [99 %-100 %] 100 % (09/12 0900) Arterial Line BP: (98-126)/(55-69) 126/55 (09/12 0900) FiO2 (%):  [30 %] 30 % (09/12 0800) Weight:  [71.7 kg (158 lb 1.5 oz)] 71.7 kg (158 lb 1.5 oz) (09/12 0500)  Intake/Output from previous day: 09/11 0701 - 09/12 0700 In: 2532.6 [I.V.:1697.6; NG/GT:485; IV Piggyback:350] Out: 1880 [Urine:1730; Emesis/NG output:150] Intake/Output from this shift: Total I/O In: 215.7 [I.V.:195.7; NG/GT:20] Out: 60 [Urine:60]  Physical Exam: Neck: no adenopathy, no carotid bruit, no JVD and supple, symmetrical, trachea midline Lungs: clear to auscultation anteriorly Heart: regular rate and rhythm, S1, S2 normal and soft systolic murmur noted Abdomen: soft, non-tender; bowel sounds normal; no masses,  no organomegaly Extremities: extremities normal, atraumatic, no cyanosis or edema  Lab Results:  Recent Labs  09/07/17 0352 09/13/2017 0353  WBC 12.3* 14.4*  HGB 12.4* 14.8  PLT 223 240    Recent Labs  09/07/2017 0353 08/29/2017 0800  NA 141 140  K 4.0 4.4  CL 111 111  CO2 22 20*  GLUCOSE 117* 123*  BUN 32* 32*  CREATININE 2.57* 2.69*    Recent Labs  09/07/17 1539 09/07/17 2151  TROPONINI 12.23* 9.84*   Hepatic Function Panel  Recent Labs  09/09/2017 0353  PROT 5.4*  ALBUMIN 1.8*  AST 79*  ALT 35  ALKPHOS 276*  BILITOT 1.4*   No results for input(s): CHOL in the last 72 hours. No results for input(s): PROTIME in the last 72 hours.  Imaging: Imaging results have been reviewed and Ct Head Wo Contrast  Result Date: 2017-09-07 CLINICAL DATA:  Altered level of consciousness, unexplained arrest EXAM: CT HEAD WITHOUT  CONTRAST TECHNIQUE: Contiguous axial images were obtained from the base of the skull through the vertex without intravenous contrast. COMPARISON:  None FINDINGS: Brain: Normal ventricular morphology. No midline shift or mass effect. Parenchymal calcification within deep LEFT frontal lobe inferior to the frontal horn of the LEFT lateral ventricle, nonspecific. No intracranial hemorrhage, mass lesion or evidence of acute infarction. No extra-axial fluid collections. Few scattered dural calcifications are incidentally noted. Vascular: Unremarkable Skull: Intact Sinuses/Orbits: Clear Other: N/A IMPRESSION: No definite acute intracranial abnormalities. Small nonspecific parenchymal calcification in the inferior LEFT frontal lobe. Electronically Signed   By: Ulyses Southward M.D.   On: 09-07-17 17:08   Dg Chest Port 1 View  Result Date: 09/02/2017 CLINICAL DATA:  Status post cardiac arrest.  Intubated patient. EXAM: PORTABLE CHEST 1 VIEW COMPARISON:  Chest x-ray of September 07, 2017 FINDINGS: The lungs are reasonably well inflated. The interstitial markings are increased. The cardiac silhouette is enlarged. There is no pneumothorax or pleural effusion. The endotracheal tube tip lies 3.8 cm above the carina. The esophagogastric tube tip and proximal port lie in the gastric lumen. The left internal jugular venous catheter tip projects over the midportion of the SVC. IMPRESSION: CHF with mild interstitial edema. Probable small bilateral pleural effusions greatest on the left. The support tubes are in reasonable position. Electronically Signed   By: David  [place] M.D.   On: 08/30/2017 07:54   Dg Chest Portable 1 View  Result Date: 09/07/17 CLINICAL DATA:  Central line placement. EXAM: PORTABLE CHEST  1 VIEW COMPARISON:  15-Sep-2017, earlier the same day FINDINGS: 1755 hours. Endotracheal tube tip is about 4.6 cm above the base of the carina. The NG tube passes into the stomach although the distal tip position is not  included on the film. Left IJ central line is new in the interval with the tip overlying the distal SVC level. No evidence for pneumothorax. The cardio pericardial silhouette is enlarged. Mild vascular congestion without overt edema. No pleural effusion. Telemetry leads overlie the chest. IMPRESSION: Left IJ central line tip overlies the distal SVC. No evidence for pneumothorax. Electronically Signed   By: Kennith Center M.D.   On: Sep 15, 2017 18:45   Dg Chest Portable 1 View  Result Date: 2017/09/15 CLINICAL DATA:  Fevers and unresponsiveness status post intubation EXAM: PORTABLE CHEST 1 VIEW COMPARISON:  08/29/2017 FINDINGS: Cardiac shadow remains enlarged. Endotracheal tube is now seen in satisfactory position. Mild vascular congestion is noted new from the prior exam which may be in part due to a poor inspiratory effort. No focal confluent infiltrate is seen. No acute bony abnormality is noted. IMPRESSION: Endotracheal tube in satisfactory position. Vascular prominence which may be in part due to poor inspiratory effort. Continued follow-up is recommended. Electronically Signed   By: Alcide Clever M.D.   On: 09/15/2017 16:21    Cardiac Studies:  Assessment/Plan:  Status post VT cardiac arrest. Elevated troponin I secondary to above. 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

## 2017-09-27 NOTE — Progress Notes (Signed)
Chaplain was called down to the patient room as the patient was coding.  I was familiar with the family from the ED 2 days prior.  There were gathered outside of the room, in the consult room and in the halls.  I consoled the family, prayed with the family, and helped support them through the transition of life.    The family experienced much grieving during this time.  There was a strong show of support for each other as many came and went.  I stayed by the bedside as the family viewed the body.  Mare Loan provided number to the funeral home so body can be prepared and transported to [place].

## 2017-09-27 NOTE — Code Documentation (Signed)
Cardiac Arrest/Code Documentation:  CODE BLUE initiated with cardiac arrest. Patient's initial rhythm was ventricular tachycardia without a perfusing pulse. Resuscitation was initiated with initial attempt at synchronized cardioversion. Bolus IV amiodarone was administered. Patient continued to be in ventricular tachycardia despite subsequent to additional cardioversion attempts. A second bolus of IV amiodarone 150 mg was administered and amiodarone drip was initiated. Bolus bicarbonate was administered as well as IV calcium chloride. Patient's magnesium and potassium were within acceptable ranges with a.m. labs. Bolus IV epinephrine was administered. Patient briefly regained a perfusing pulse and rhythm twice during the course of his resuscitation. Ultimately his rhythm deteriorated to asystole and despite aggressive attempts with good quality CPR we could not regain spontaneous circulation. Family was updated by both myself as well as other staff post arrest. Hospital chaplain was with the patient's family postarrest to provide spiritual support. Nursing staff are preparing the body to be viewed by family at their request. Time of death was 10:55 AM on 2017-10-04.

## 2017-09-27 NOTE — Progress Notes (Signed)
Wasted 200cc Fentanyl IV down the sink with Ellwood Handler, RN.    Pt had belongings checked in with security, including $930 in cash.  Wallet returned to [employer], next of kin/ step daughter.    Aretta Nip, RN

## 2017-09-27 NOTE — Progress Notes (Signed)
Pharmacy Antibiotic Note  Marcus Whitaker is a 51 y.o. male admitted on 09/23/2017 with VT arrest. Pharmacy has been consulted for vancomycin and Zosyn dosing for sepsis. Vancomycin trough this morning supratherapeutic at 33 mcg/ml despite being drawn appropriately. SCr up slightly, WBC remains elevated, increased pressor requirement overnight.  Plan: -Continue Zosyn 3.375g IV q8h EI -Hold vancomycin - check random level in am for further dosing -Monitor renal function, LOT, culture results  Height: 5\' 7"  (170.2 cm) (Per family) Weight: 158 lb 1.5 oz (71.7 kg) (74.1kg - 2.93kg (for pads)) IBW/kg (Calculated) : 66.1  Temp (24hrs), Avg:92.6 F (33.7 C), Min:90.9 F (32.7 C), Max:97 F (36.1 C)   Recent Labs Lab 09-23-2017 1542  September 23, 2017 2040  09/07/17 0352  09/07/17 1320 09/07/17 2000 09/07/17 2354 09/04/2017 0353 09/11/2017 0800  WBC  --   --   --   --  12.3*  --   --   --   --  14.4*  --   CREATININE 2.10*  < >  --   < > 2.29*  < > 2.34* 2.43* 2.47* 2.57* 2.69*  LATICACIDVEN 10.48*  --  2.2*  --   --   --   --   --   --   --   --   VANCOTROUGH  --   --   --   --   --   --   --   --   --   --  33*  < > = values in this interval not displayed.  Estimated Creatinine Clearance: 30.7 mL/min (A) (by C-G formula based on SCr of 2.69 mg/dL (H)).    No Known Allergies  Antimicrobials this admission: 9/10 Vancomycin >>  9/10 Zosyn >>   Dose adjustments this admission: 9/12 VT = 33 - hold vancomycin  Microbiology results: 9/10 BCx: NGTD  9/2 MRSA PCR: negative  Thank you for allowing pharmacy to be a part of this patient's care.  Fredonia Highland, PharmD PGY-2 Cardiology Pharmacy Resident Pager: [phone] 09/06/2017

## 2017-09-27 NOTE — Death Summary Note (Signed)
PULMONARY / CRITICAL CARE MEDICINE   Name: Marcus Whitaker MRN: [id number] DOB: 1966/04/13    ADMISSION DATE:  09/14/17 CONSULTATION DATE:  9/10  REFERRING MD:  EDP  CHIEF COMPLAINT:  Cardiac arrest  HISTORY OF PRESENT ILLNESS:   51 year old male with no PMH significant for CAD status post suspected recent anterolateral wall MI, non-insulin-dependent T2 DM, CKD stage IV, HLD who presented to ED 9/10 from home unresponsive. Initially he was hypoglycemic and given amp dextrose with improvement in glucose, but he remained unresponsive. He was intubated for airway protection. Shortly after intubation he suffered a VT arrest with brief episodes of ROSC intermittently. Finally defibrillated into ST after about 15 mins total arrest time. He was started on epinephrine infusion for shock. PCCM asked to admit.   Of note he was recently admitted for NSTEMI and underwent cardiac cath demonstrating no significant coronary occlusion.   SUBJECTIVE:  Remains on norepi - increased needs during night Rewarming - will finish in ~30 mins  TF on hold r/t increased Mg, phos  CVP 2    VITAL SIGNS: BP 118/63   Pulse 89   Temp (!) 97 F (36.1 C) (Core (Comment))   Resp 15   Ht 5\' 7"  (1.702 m) Comment: Per family  Wt 71.7 kg (158 lb 1.5 oz) Comment: 74.1kg - 2.93kg (for pads)  SpO2 100%   BMI 24.76 kg/m   HEMODYNAMICS: CVP:  [0 mmHg-7 mmHg] 2 mmHg  VENTILATOR SETTINGS: Vent Mode: PRVC FiO2 (%):  [30 %] 30 % Set Rate:  [12 bmp] 12 bmp Vt Set:  [570 mL] 570 mL PEEP:  [5 cmH20] 5 cmH20 Plateau Pressure:  [19 cmH20-22 cmH20] 19 cmH20  INTAKE / OUTPUT: I/O last 3 completed shifts: In: 5382.1 [I.V.:3813.8; NG/GT:515; IV Piggyback:1053.3] Out: 5100 [Urine:4950; Emesis/NG output:150]  PHYSICAL EXAMINATION: General:  NAD on vent  Neuro:  RASS -5, paralyzed on hypothermia protocol  HEENT:  [place]/AT, PERRL, no JVD Cardiovascular:  RRR, no MRG Lungs:  resps even non labored on vent, few scattered  rhonchi  Abdomen:  Soft, non-distended Musculoskeletal:  No acute deformity Skin:  Grossly intact  LABS:  BMET  Recent Labs Lab 09/07/17 2354 09/13/2017 0353 08/31/2017 0800  NA 142 141 140  K 3.9 4.0 4.4  CL 111 111 111  CO2 22 22 20*  BUN 34* 32* 32*  CREATININE 2.47* 2.57* 2.69*  GLUCOSE 135* 117* 123*    Electrolytes  Recent Labs Lab 09/07/17 0821  09/07/17 1539  09/07/17 2354 09/07/2017 0353 09/12/2017 0800  CALCIUM  --   < >  --   < > 8.5* 8.2* 8.1*  MG 3.1*  --  3.1*  --   --  2.8*  --   PHOS 3.0  --  4.4  --   --  5.4*  --   < > = values in this interval not displayed.  CBC  Recent Labs Lab 09/07/17 0211 09/07/17 0352 09/22/2017 0353  WBC  --  12.3* 14.4*  HGB 10.5* 12.4* 14.8  HCT 31.0* 35.3* 43.5  PLT  --  223 240    Coag's  Recent Labs Lab 14-Sep-2017 2001 09/07/17 0352  APTT 31 33  INR 1.18 1.13    Sepsis Markers  Recent Labs Lab 09/14/2017 1542 09/14/2017 2040  LATICACIDVEN 10.48* 2.2*    ABG  Recent Labs Lab September 14, 2017 1950 09/07/17 0500 09/07/17 0924  PHART 7.318* 7.532* 7.486*  PCO2ART 35.1 26.8* 31.9*  PO2ART 229* 148* 179.0*  Liver Enzymes  Recent Labs Lab 09/18/2017 1530 October 06, 2017 0353  AST 41 79*  ALT 29 35  ALKPHOS 313* 276*  BILITOT 0.8 1.4*  ALBUMIN 2.3* 1.8*    Cardiac Enzymes  Recent Labs Lab 09/07/17 1117 09/07/17 1539 09/07/17 2151  TROPONINI 13.74* 12.23* 9.84*    Glucose  Recent Labs Lab 09/07/17 1549 09/07/17 1737 09/07/17 2003 09/07/17 2334 09/07/17 2356 10-06-2017 0353  GLUCAP 88 97 134* 115* 128* 111*    Imaging Dg Chest Port 1 View  Result Date: 10-06-17 CLINICAL DATA:  Status post cardiac arrest.  Intubated patient. EXAM: PORTABLE CHEST 1 VIEW COMPARISON:  Chest x-ray of September 06, 2017 FINDINGS: The lungs are reasonably well inflated. The interstitial markings are increased. The cardiac silhouette is enlarged. There is no pneumothorax or pleural effusion. The endotracheal tube  tip lies 3.8 cm above the carina. The esophagogastric tube tip and proximal port lie in the gastric lumen. The left internal jugular venous catheter tip projects over the midportion of the SVC. IMPRESSION: CHF with mild interstitial edema. Probable small bilateral pleural effusions greatest on the left. The support tubes are in reasonable position. Electronically Signed   By: David  [place] M.D.   On: 10/06/2017 07:54     STUDIES:  CT head 9/10 >  No definite acute intracranial abnormalities. Small nonspecific parenchymal calcification in the inferior LEFT frontal lobe.  EEG 9/11>>> This record shows evidence of diffuse or bilateral cerebral dysfunction that is nonspecific in etiology, and can be seen with anoxic brain injury, anesthetic medications, or severe toxic/metabolic encephalopathy. There were no electrographic seizures in this study. 2D echo 9/11>>> EF 35-40%, grade 1 diastolic dysfunction, diffuse hypokinesis, trivial pericardial effusion   CULTURES: Blood 9/10 >  ANTIBIOTICS: Zosyn 9/10 > Vancomycin 9/10 >  SIGNIFICANT EVENTS: 9/5 discharged from NSTEMI related admission.  9/10 admit unresponsive, VT cardiac arrest.   LINES/TUBES: ETT 9/10 > [facility] CVL 9/10 >  DISCUSSION: 51 year old male recently admitted for NSTEMI presenting 9/10 unresponsive, then suffering VT cardiac arrest about 15 mins in duration. Intubated on pressors. PCCm to admit to ICU for TTM 33C protocol.   ASSESSMENT / PLAN:  PULMONARY A: Acute hypercarbic respiratory failure  P:   Vent support - 8cc/kg  F/u CXR  F/u ABG Daily SBT once rewarmed  VAP bundle  CARDIOVASCULAR A:  VT cardiac arrest CAD Non-ischemic cardiomyopathy Recent NSTEMI H/o HTN, HLD Shock - cardiogenic  P:  Hypothermia protocol - Rewarming   1L NS bolus now - CVP =2 with increasing pressor needs overnight  Levophed infusion to keep MAP > Cardiology (harwani) following  Echo as above   RENAL A:   CKD IV  High  AG acidosis > improved Hypokalemia Hyperphos  HyperMg   P:   Follow BMP Follow UOP - adequate for now  NS bolus as above for CVP 2, increasing pressor needs  Holding TF  Recheck this pm and in am   GASTROINTESTINAL A:   No acute issues  P:   NPO Protonix for SUP TF - on hold for now r/t significant hyperMg, phos  Resume TF this pm after f/u chem   HEMATOLOGIC A:   No acute issues  P:  Heparin SQ Follow CBC  INFECTIOUS A:   ? HCAP  P:   Continue zosyn, vancomycin F/u pct   ENDOCRINE A:   DM  P:   SSI    NEUROLOGIC A:   Acute anoxic encephalopathy  P:   RASS  goal: -5 while on cooling protocol WUA once rewarmed  Fentanyl gtt  Nimbex for NMB - off soon  EEG as above Will likely need neuro involvement at some point.  Wean off propofol first r/t hypotension - add versed if additional agent needed.   FAMILY  - Updates: discussed at length with family at bedside 9/12  - Inter-disciplinary family meet or Palliative Care meeting due by:  9/17   Dirk Dress, NP 08/28/2017  9:19 AM Pager: [phone] or [phone]

## 2017-09-27 NOTE — Death Summary Note (Signed)
DEATH NOTE: For a complete accounting of the patient's history and physical exam on presentation please refer to the H&P dictated on 09-26-17. Patient was a 51 year old male with a past medical history of nonobstructive coronary artery disease and insulin-dependent diabetes mellitus type 2. Patient was recently admitted to hospital with non-ST elevation MI and underwent left heart catheterization which showed nonobstructive coronary artery disease. Patient presented to the emergency department on 9/10 after being found unresponsive. Initially the patient was noted to be hypoglycemic and serum glucose improved after receiving IV dextrose. Despite improvement in glucose he remained unresponsive. Patient was endotracheally intubated in the emergency department for airway protection and shortly after intubation suffered a ventricular tachycardia with cardiac arrest. Patient was resuscitated with brief periods of return of spontaneous circulation and finally regained sinus tachycardia after approximately 15 minutes of resuscitation. Patient was in a state of shock and started on epinephrine infusion. Broad-spectrum antibiotics were started for possible pneumonia/infection and therapeutic hypothermia was started per protocol. CT imaging of the head revealed no acute intracranial abnormalities. However, the patient did undergo EEG which showed burst suppression. Patient had repeat echocardiogram performed on 9/11 that was compared with his echocardiogram from 9/4 with a worsening in his ejection fraction. The patient also developed atrial fibrillation with rapid ventricular response. Patient's cardiologist follow along during the course of his admission and provided further direction in care. The morning of 09-28-2017 the patient suffered a cardiac arrest with an initial rhythm of ventricular tachycardia without a perfusing pulse. Attempted synchronized cardioversion was performed and ACLS protocol continued with bolus  amiodarone as well as initiation of amiodarone infusion. Bolus calcium chloride was also administered as well as bolus of vasopressors. Despite every aggressive effort the patient could not regain spontaneous circulation. I pronounced time of death at 10:55 AM on 09/28/17. Family was updated by myself post arrest. Hospital chaplain was at bedside to provide spiritual support for the family.  DIAGNOSES AT DEATH: 1. Cardiogenic shock 2. Acute on chronic systolic congestive heart failure 3. Acute encephalopathy 4. Acute hypercarbic respiratory failure 5. Ventricular tachycardia 6. Hypoglycemia 7. History of nonischemic cardiomyopathy 8. Essential hypertension 9. Hyperlipidemia 10. Chronic renal failure stage IV

## 2017-09-27 DEATH — deceased

## 2018-11-16 IMAGING — CT CT ABD-PELV W/O CM
2 of 4 series · 15 of 46 positions shown, 17 images · non-contrast
Comparison: None.

CLINICAL DATA: Unintentional weight loss.

EXAM:
CT ABDOMEN AND PELVIS WITHOUT CONTRAST
TECHNIQUE: Multidetector CT imaging of the abdomen and pelvis was performed
following the standard protocol without IV contrast.

[Series 3: a/p w/o 5mm · axial · non-contrast · 0.72mm/px · z∈[+856,+1296]mm · 12 of 97 slices shown, 14 images]
[im 5/97  soft-tissue]
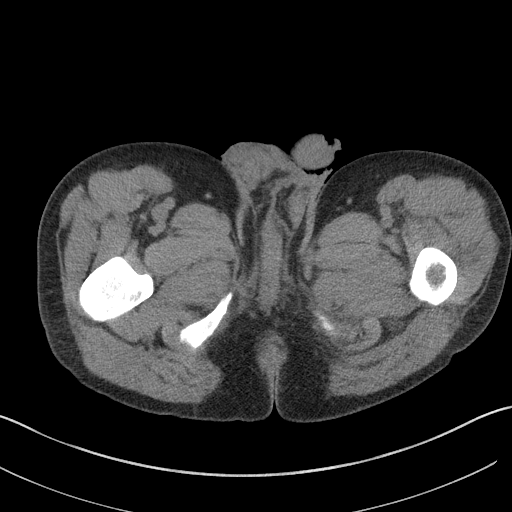
[im 5/97  bone]
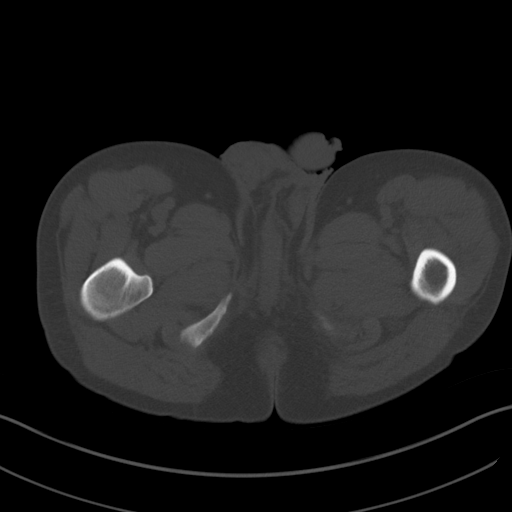
[im 13/97  soft-tissue]
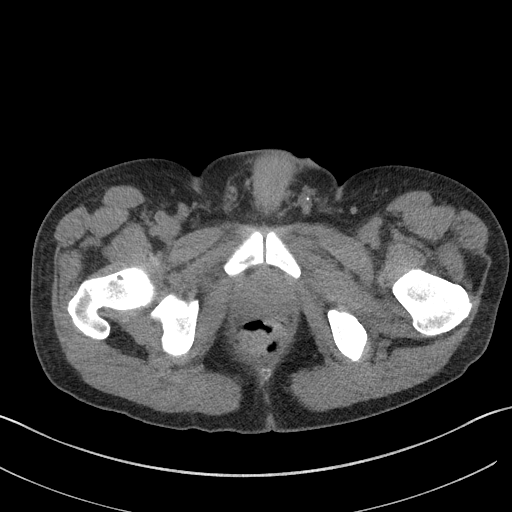
[im 21/97  soft-tissue]
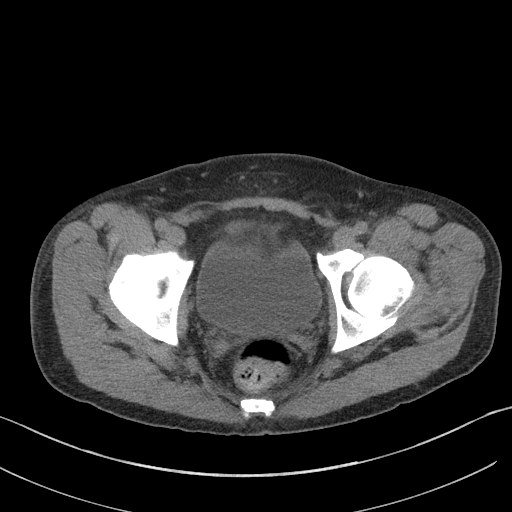
[im 29/97  soft-tissue]
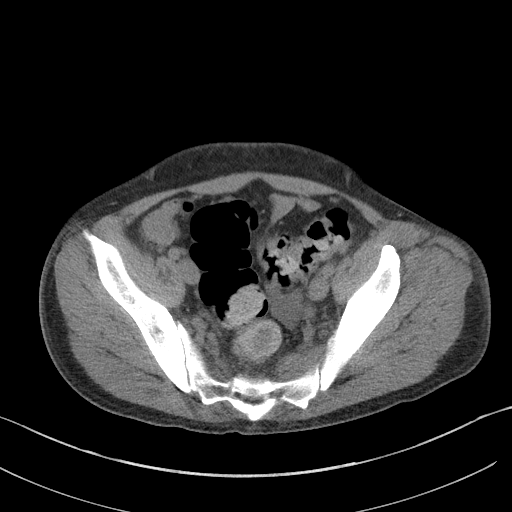
[im 37/97  soft-tissue]
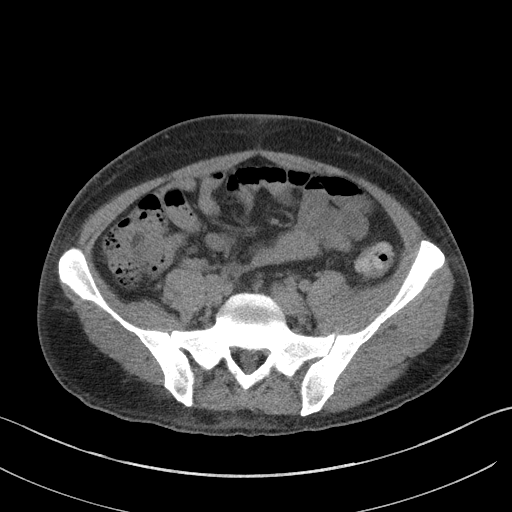
[im 45/97  soft-tissue]
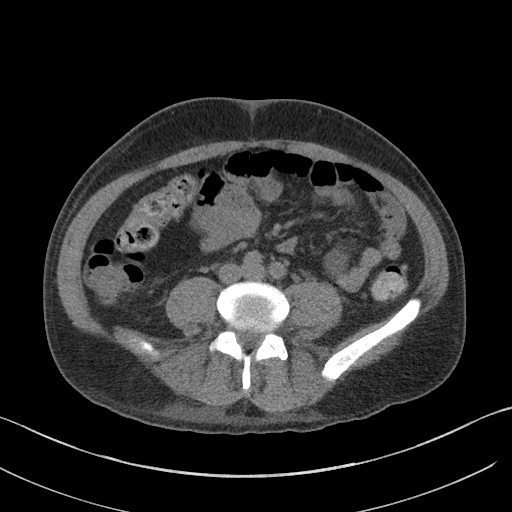
[im 53/97  soft-tissue]
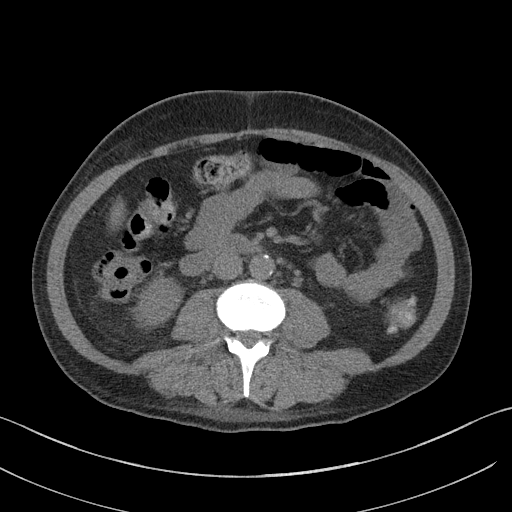
[im 61/97  soft-tissue]
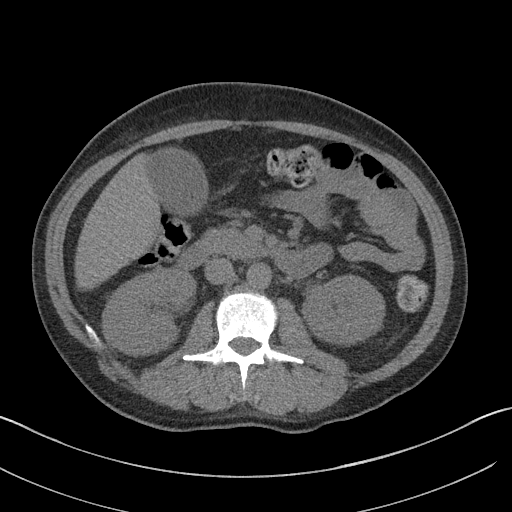
[im 69/97  soft-tissue]
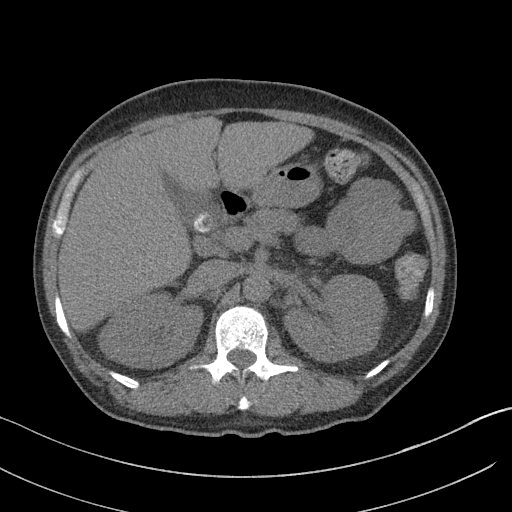
[im 69/97  bone]
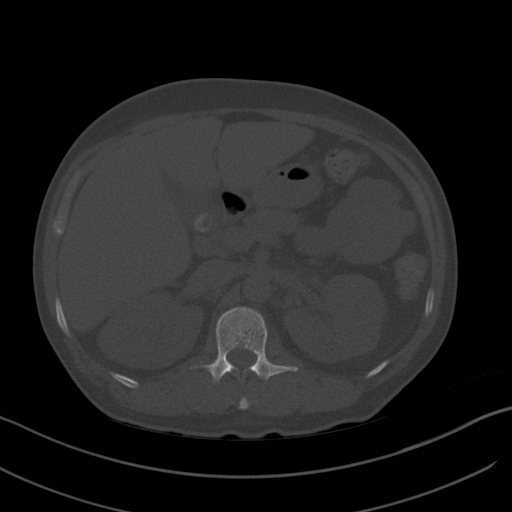
[im 77/97  soft-tissue]
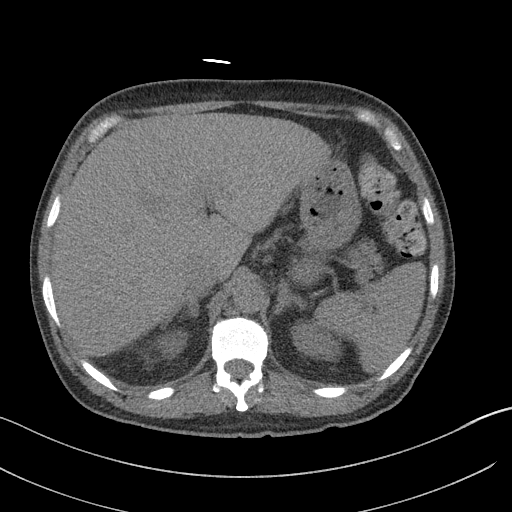
[im 85/97  soft-tissue]
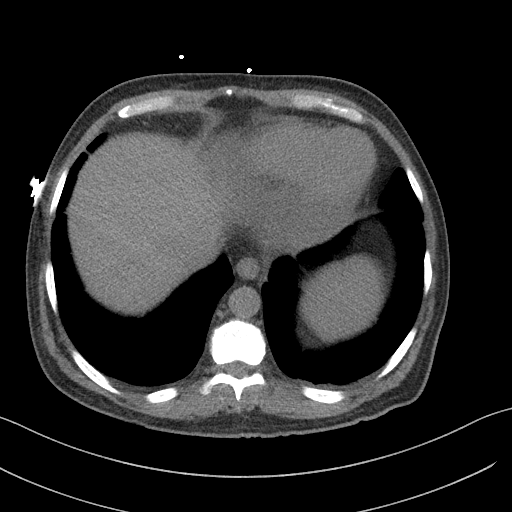
[im 93/97  soft-tissue]
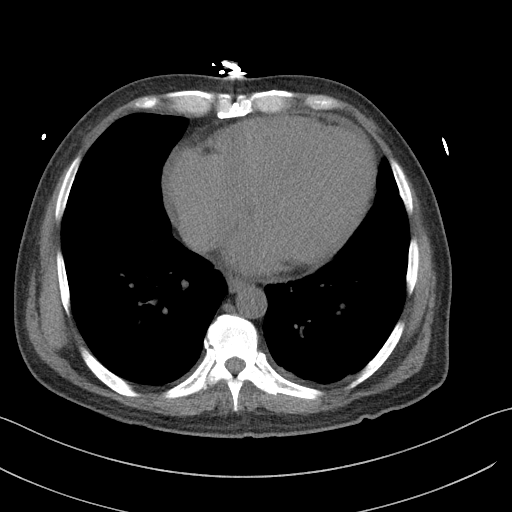

[Series 6: a/p w/o cor · coronal · non-contrast · 0.64mm/px · 3 of 107 slices shown]
[im 36/107  soft-tissue]
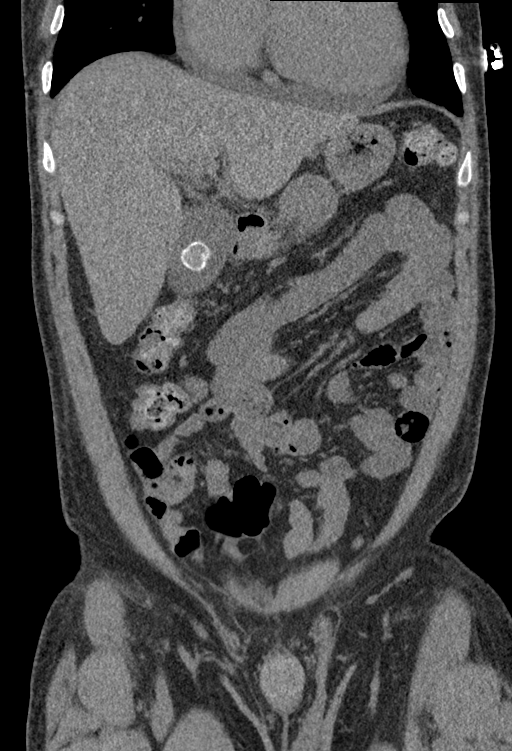
[im 48/107  soft-tissue]
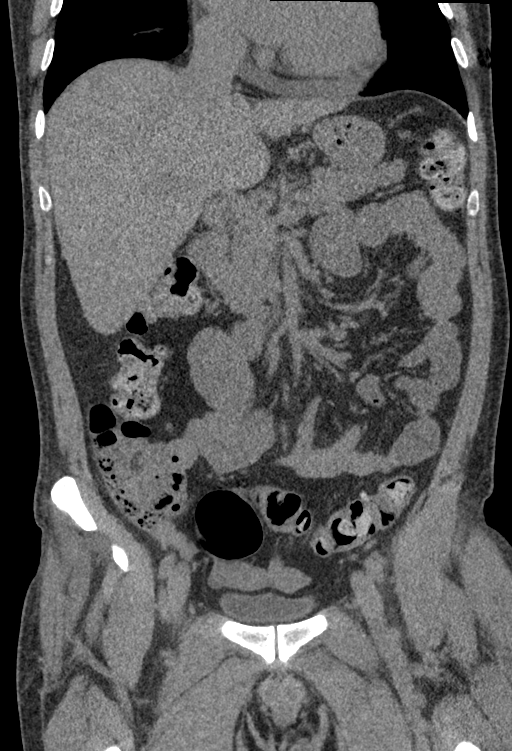
[im 59/107  soft-tissue]
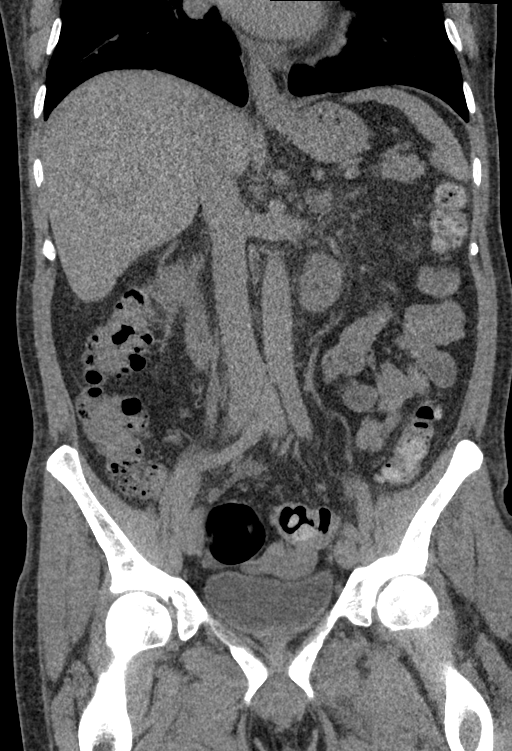

[15 of 46 positions shown; findings below may reference images not displayed]

FINDINGS: Lower chest: Subcentimeter coarsely calcified left lower lobe
granulomas. Interlobular septal thickening at the lung bases
suggesting mild pulmonary edema. Cardiomegaly. Small pericardial
effusion/ thickening.

Hepatobiliary: Normal liver size. Simple 1.7 cm anterior left liver
lobe cysts. No additional liver lesions. Cholelithiasis. No
significant gallbladder distention. Borderline mild diffuse
gallbladder wall thickening without pericholecystic fluid. No
biliary ductal dilatation.

Pancreas: Normal, with no mass or duct dilation.

Spleen: Normal size. No mass.

Adrenals/Urinary Tract: No discrete adrenal nodules. No contour
deforming renal masses. Symmetric nonspecific haziness of the
perinephric fat. No hydronephrosis. No renal stones. Normal bladder.

Stomach/Bowel: Grossly normal stomach. Normal caliber small bowel
with no small bowel wall thickening. Normal appendix. Mild
descending and proximal sigmoid colonic diverticulosis, with no
large bowel wall thickening or pericolonic fat stranding.

Vascular/Lymphatic: Atherosclerotic nonaneurysmal abdominal aorta.
No pathologically enlarged lymph nodes in the abdomen or pelvis.

Reproductive: Top-normal size prostate.

Other: Small volume free fluid in the pelvis. No fluid collections.
No pneumoperitoneum.

Musculoskeletal: No aggressive appearing focal osseous lesions.
Subcentimeter sclerotic superior right iliac wing lesion is
nonspecific and probably a benign bone island. Minimal thoracolumbar
spondylosis.
IMPRESSION: 1. No evidence of bowel obstruction or acute bowel inflammation.
Mild left colonic diverticulosis, with no evidence of acute
diverticulitis.
2. Cholelithiasis. Nonspecific borderline mild diffuse gallbladder
wall thickening. If there is clinical concern for acute
cholecystitis, advise correlation with right upper quadrant
sonography .
3. Cardiomegaly. Small pericardial effusion/thickening. Interlobular
septal thickening at the lung bases suggests mild pulmonary edema .
4. Small volume free fluid in the pelvis.
5.  Aortic Atherosclerosis ([id number]).
